# Patient Record
Sex: Male | Born: 1950 | Race: Black or African American | Hispanic: No | Marital: Married | Smoking: Never smoker
Health system: Southern US, Community
[De-identification: ages and names within clinical notes are randomized; demographics above are authoritative.]

## PROBLEM LIST (undated history)

## (undated) DIAGNOSIS — M545 Low back pain, unspecified: Secondary | ICD-10-CM

## (undated) DIAGNOSIS — I509 Heart failure, unspecified: Secondary | ICD-10-CM

## (undated) DIAGNOSIS — I219 Acute myocardial infarction, unspecified: Secondary | ICD-10-CM

## (undated) DIAGNOSIS — M79673 Pain in unspecified foot: Secondary | ICD-10-CM

## (undated) DIAGNOSIS — E669 Obesity, unspecified: Secondary | ICD-10-CM

## (undated) DIAGNOSIS — N184 Chronic kidney disease, stage 4 (severe): Secondary | ICD-10-CM

## (undated) DIAGNOSIS — D649 Anemia, unspecified: Secondary | ICD-10-CM

## (undated) DIAGNOSIS — I639 Cerebral infarction, unspecified: Secondary | ICD-10-CM

## (undated) DIAGNOSIS — M1990 Unspecified osteoarthritis, unspecified site: Secondary | ICD-10-CM

## (undated) DIAGNOSIS — I10 Essential (primary) hypertension: Secondary | ICD-10-CM

## (undated) DIAGNOSIS — I739 Peripheral vascular disease, unspecified: Secondary | ICD-10-CM

## (undated) DIAGNOSIS — E079 Disorder of thyroid, unspecified: Secondary | ICD-10-CM

## (undated) DIAGNOSIS — I2510 Atherosclerotic heart disease of native coronary artery without angina pectoris: Secondary | ICD-10-CM

## (undated) DIAGNOSIS — K219 Gastro-esophageal reflux disease without esophagitis: Secondary | ICD-10-CM

## (undated) DIAGNOSIS — E785 Hyperlipidemia, unspecified: Secondary | ICD-10-CM

## (undated) DIAGNOSIS — H269 Unspecified cataract: Secondary | ICD-10-CM

## (undated) DIAGNOSIS — Z9289 Personal history of other medical treatment: Secondary | ICD-10-CM

## (undated) DIAGNOSIS — G8929 Other chronic pain: Secondary | ICD-10-CM

## (undated) DIAGNOSIS — Z95810 Presence of automatic (implantable) cardiac defibrillator: Secondary | ICD-10-CM

## (undated) HISTORY — DX: Hyperlipidemia, unspecified: E78.5

## (undated) HISTORY — DX: Essential (primary) hypertension: I10

## (undated) HISTORY — DX: Peripheral vascular disease, unspecified: I73.9

## (undated) HISTORY — PX: COLONOSCOPY: SHX174

## (undated) HISTORY — PX: INSERT / REPLACE / REMOVE PACEMAKER: SUR710

## (undated) HISTORY — PX: INCISE AND DRAIN ABCESS: PRO64

## (undated) HISTORY — PX: TONSILLECTOMY: SUR1361

## (undated) HISTORY — DX: Obesity, unspecified: E66.9

## (undated) HISTORY — DX: Cerebral infarction, unspecified: I63.9

## (undated) HISTORY — DX: Pain in unspecified foot: M79.673

## (undated) HISTORY — PX: CHOLECYSTECTOMY: SHX55

## (undated) HISTORY — PX: PERIPHERAL ARTERIAL STENT GRAFT: SHX2220

## (undated) HISTORY — DX: Anemia, unspecified: D64.9

## (undated) HISTORY — PX: I & D EXTREMITY: SHX5045

## (undated) HISTORY — PX: CARDIAC CATHETERIZATION: SHX172

## (undated) HISTORY — DX: Gastro-esophageal reflux disease without esophagitis: K21.9

## (undated) HISTORY — DX: Acute myocardial infarction, unspecified: I21.9

## (undated) HISTORY — DX: Atherosclerotic heart disease of native coronary artery without angina pectoris: I25.10

---

## 1995-05-24 DIAGNOSIS — I639 Cerebral infarction, unspecified: Secondary | ICD-10-CM

## 1995-05-24 HISTORY — DX: Cerebral infarction, unspecified: I63.9

## 1998-08-04 ENCOUNTER — Encounter: Payer: Self-pay | Admitting: Gastroenterology

## 1998-08-11 ENCOUNTER — Encounter: Payer: Self-pay | Admitting: Gastroenterology

## 1999-08-06 ENCOUNTER — Emergency Department (HOSPITAL_COMMUNITY): Admission: EM | Admit: 1999-08-06 | Discharge: 1999-08-06 | Payer: Self-pay | Admitting: Emergency Medicine

## 1999-10-29 ENCOUNTER — Encounter: Admission: RE | Admit: 1999-10-29 | Discharge: 2000-01-27 | Payer: Self-pay | Admitting: Internal Medicine

## 2001-02-04 ENCOUNTER — Emergency Department (HOSPITAL_COMMUNITY): Admission: EM | Admit: 2001-02-04 | Discharge: 2001-02-04 | Payer: Self-pay | Admitting: Emergency Medicine

## 2001-10-27 ENCOUNTER — Emergency Department (HOSPITAL_COMMUNITY): Admission: EM | Admit: 2001-10-27 | Discharge: 2001-10-27 | Payer: Self-pay | Admitting: Emergency Medicine

## 2002-03-06 ENCOUNTER — Emergency Department (HOSPITAL_COMMUNITY): Admission: EM | Admit: 2002-03-06 | Discharge: 2002-03-06 | Payer: Self-pay | Admitting: Emergency Medicine

## 2002-07-08 ENCOUNTER — Ambulatory Visit: Admission: RE | Admit: 2002-07-08 | Discharge: 2002-07-08 | Payer: Self-pay | Admitting: Internal Medicine

## 2002-07-09 ENCOUNTER — Inpatient Hospital Stay (HOSPITAL_COMMUNITY): Admission: EM | Admit: 2002-07-09 | Discharge: 2002-07-12 | Payer: Self-pay | Admitting: Emergency Medicine

## 2002-07-10 ENCOUNTER — Encounter: Payer: Self-pay | Admitting: Internal Medicine

## 2002-07-17 ENCOUNTER — Ambulatory Visit (HOSPITAL_COMMUNITY): Admission: RE | Admit: 2002-07-17 | Discharge: 2002-07-17 | Payer: Self-pay | Admitting: Internal Medicine

## 2002-07-17 ENCOUNTER — Encounter: Payer: Self-pay | Admitting: Internal Medicine

## 2002-07-20 ENCOUNTER — Emergency Department (HOSPITAL_COMMUNITY): Admission: EM | Admit: 2002-07-20 | Discharge: 2002-07-21 | Payer: Self-pay | Admitting: Emergency Medicine

## 2002-07-26 ENCOUNTER — Encounter: Payer: Self-pay | Admitting: Orthopedic Surgery

## 2002-07-26 ENCOUNTER — Inpatient Hospital Stay (HOSPITAL_COMMUNITY): Admission: AD | Admit: 2002-07-26 | Discharge: 2002-07-29 | Payer: Self-pay | Admitting: Orthopedic Surgery

## 2002-07-29 ENCOUNTER — Encounter (INDEPENDENT_AMBULATORY_CARE_PROVIDER_SITE_OTHER): Payer: Self-pay | Admitting: *Deleted

## 2002-08-06 ENCOUNTER — Encounter: Admission: RE | Admit: 2002-08-06 | Discharge: 2002-11-04 | Payer: Self-pay | Admitting: Internal Medicine

## 2002-09-05 ENCOUNTER — Emergency Department (HOSPITAL_COMMUNITY): Admission: EM | Admit: 2002-09-05 | Discharge: 2002-09-05 | Payer: Self-pay | Admitting: Emergency Medicine

## 2003-03-17 ENCOUNTER — Ambulatory Visit (HOSPITAL_COMMUNITY): Admission: RE | Admit: 2003-03-17 | Discharge: 2003-03-17 | Payer: Self-pay | Admitting: Cardiology

## 2003-03-17 ENCOUNTER — Encounter: Payer: Self-pay | Admitting: Cardiology

## 2003-03-19 ENCOUNTER — Inpatient Hospital Stay (HOSPITAL_COMMUNITY): Admission: RE | Admit: 2003-03-19 | Discharge: 2003-03-21 | Payer: Self-pay | Admitting: Cardiology

## 2003-04-04 ENCOUNTER — Ambulatory Visit (HOSPITAL_COMMUNITY): Admission: RE | Admit: 2003-04-04 | Discharge: 2003-04-04 | Payer: Self-pay | Admitting: Cardiology

## 2003-04-21 ENCOUNTER — Encounter (HOSPITAL_COMMUNITY): Admission: RE | Admit: 2003-04-21 | Discharge: 2003-07-20 | Payer: Self-pay | Admitting: Cardiology

## 2004-04-05 ENCOUNTER — Ambulatory Visit: Payer: Self-pay | Admitting: Cardiology

## 2004-04-06 ENCOUNTER — Ambulatory Visit: Payer: Self-pay

## 2004-04-08 ENCOUNTER — Ambulatory Visit: Payer: Self-pay | Admitting: Internal Medicine

## 2004-05-06 ENCOUNTER — Ambulatory Visit: Payer: Self-pay | Admitting: Cardiology

## 2004-06-09 ENCOUNTER — Encounter: Admission: RE | Admit: 2004-06-09 | Discharge: 2004-06-09 | Payer: Self-pay | Admitting: Nephrology

## 2004-06-10 ENCOUNTER — Encounter: Admission: RE | Admit: 2004-06-10 | Discharge: 2004-09-08 | Payer: Self-pay | Admitting: Nephrology

## 2004-06-24 ENCOUNTER — Encounter: Admission: RE | Admit: 2004-06-24 | Discharge: 2004-06-24 | Payer: Self-pay | Admitting: Nephrology

## 2004-08-02 ENCOUNTER — Ambulatory Visit: Payer: Self-pay | Admitting: Cardiology

## 2004-08-24 ENCOUNTER — Ambulatory Visit: Payer: Self-pay

## 2004-08-26 ENCOUNTER — Ambulatory Visit: Payer: Self-pay | Admitting: Cardiology

## 2004-10-14 ENCOUNTER — Ambulatory Visit: Payer: Self-pay | Admitting: Internal Medicine

## 2005-03-18 ENCOUNTER — Ambulatory Visit: Payer: Self-pay | Admitting: Internal Medicine

## 2005-03-21 ENCOUNTER — Ambulatory Visit: Payer: Self-pay | Admitting: Internal Medicine

## 2005-10-05 ENCOUNTER — Ambulatory Visit: Payer: Self-pay | Admitting: Cardiology

## 2006-04-18 ENCOUNTER — Ambulatory Visit: Payer: Self-pay | Admitting: Cardiology

## 2006-04-25 ENCOUNTER — Ambulatory Visit: Payer: Self-pay

## 2006-05-01 ENCOUNTER — Ambulatory Visit: Payer: Self-pay

## 2006-05-23 HISTORY — PX: GASTRIC BYPASS: SHX52

## 2006-05-30 ENCOUNTER — Encounter: Admission: RE | Admit: 2006-05-30 | Discharge: 2006-05-30 | Payer: Self-pay | Admitting: Nephrology

## 2006-05-31 ENCOUNTER — Ambulatory Visit: Payer: Self-pay | Admitting: Cardiology

## 2007-02-28 DIAGNOSIS — E119 Type 2 diabetes mellitus without complications: Secondary | ICD-10-CM

## 2007-02-28 DIAGNOSIS — I10 Essential (primary) hypertension: Secondary | ICD-10-CM | POA: Insufficient documentation

## 2007-02-28 DIAGNOSIS — N19 Unspecified kidney failure: Secondary | ICD-10-CM | POA: Insufficient documentation

## 2007-02-28 DIAGNOSIS — I509 Heart failure, unspecified: Secondary | ICD-10-CM | POA: Insufficient documentation

## 2007-02-28 DIAGNOSIS — Z8679 Personal history of other diseases of the circulatory system: Secondary | ICD-10-CM | POA: Insufficient documentation

## 2007-02-28 DIAGNOSIS — I2510 Atherosclerotic heart disease of native coronary artery without angina pectoris: Secondary | ICD-10-CM | POA: Insufficient documentation

## 2008-01-02 ENCOUNTER — Ambulatory Visit: Payer: Self-pay | Admitting: Cardiology

## 2008-08-25 ENCOUNTER — Encounter: Payer: Self-pay | Admitting: Gastroenterology

## 2008-10-09 ENCOUNTER — Ambulatory Visit: Payer: Self-pay | Admitting: Gastroenterology

## 2008-10-09 DIAGNOSIS — R195 Other fecal abnormalities: Secondary | ICD-10-CM

## 2008-10-09 DIAGNOSIS — K219 Gastro-esophageal reflux disease without esophagitis: Secondary | ICD-10-CM | POA: Insufficient documentation

## 2008-10-09 DIAGNOSIS — D509 Iron deficiency anemia, unspecified: Secondary | ICD-10-CM

## 2008-12-09 ENCOUNTER — Telehealth: Payer: Self-pay | Admitting: Gastroenterology

## 2008-12-10 ENCOUNTER — Ambulatory Visit: Payer: Self-pay | Admitting: Gastroenterology

## 2009-03-23 ENCOUNTER — Encounter: Payer: Self-pay | Admitting: Internal Medicine

## 2010-06-12 ENCOUNTER — Encounter: Payer: Self-pay | Admitting: Cardiology

## 2010-07-16 ENCOUNTER — Encounter: Payer: Self-pay | Admitting: Internal Medicine

## 2010-08-10 NOTE — Letter (Signed)
Summary: [facility]  [facility]   Imported By: Marylou Mccoy 08/03/2010 14:42:04  _____________________________________________________________________  External Attachment:    Type:   Image     Comment:   External Document

## 2010-08-29 LAB — GLUCOSE, CAPILLARY: Glucose-Capillary: 74 mg/dL (ref 70–99)

## 2010-10-05 NOTE — Assessment & Plan Note (Signed)
[facility]                            CARDIOLOGY OFFICE NOTE   NAME:Willie [name], Willie [name]                       MRN:          [id number]  DATE:01/02/2008                            DOB:          Oct 16, 1950    PRIMARY CARE PHYSICIAN:  Dr. Chales Abrahams in [place].   PRIMARY CARDIOLOGIST:  Marca Ancona, MD, new.   He was patient of Dr. Samule Ohm.   RENAL:  Dr. Darrick Penna.   This is a 60 year old African American male patient who has a long  history of atherosclerotic coronary artery disease and ischemic  cardiomyopathy.  He was last seen in January 2008 at which time he  obtained cardiac clearance for undergoing gastric bypass surgery.  Since  then, he has dropped his weight from 316 pounds down to 186 and done  well.  Multiple medications have been stopped, since his weight loss.  He comes in today because he has occasions when he stands up where he  feels a weakness from his waist down, like he cannot stand.  He denies  any dizziness, presyncope, chest pain, or shortness of breath associated  with this.  He said when he was on vacation, he had switched to his  Coreg from the evening to the morning and he actually felt worse when he  did this.  He has not taken his Coreg in over 24 hours and his blood  pressure is up, when he comes in today.  This is the only  antihypertensive that he is currently on as the rest have been  discontinued after his weight loss.   CURRENT MEDICATIONS:  1. Aciphex 20 mg daily.  2. Coreg 40 mg daily.  3. Aspirin 81 mg daily.  4. Simvastatin 20 mg daily.  5. Ferrous sulfate 5 mg daily.  6. Vitamin D 5000 IU weekly.  7. Zemplar 1 mcg daily.  8. Isopure protein drink daily.   PHYSICAL EXAMINATION:  GENERAL:  This is a very pleasant 60 year old  [place] male, in no acute distress.  VITAL SIGNS:  Blood pressure 159/93, pulse 60, and weight 186.  We did  do orthostatics and his blood pressure actually when lying down was  166/92, sitting 151/88, and when he stood up it was 124/80.  He did not  get the weakness sensation when his blood pressure dropped after 5  minutes of standing, hence blood pressure came back up to 137/80.  NECK:  Without JVD, HJR, bruit, or thyroid enlargement.  LUNGS:  Clear in anterior, posterior, and lateral.  HEART:  Regular rate and rhythm at 60 beats per minute.  Normal S1 and  S2.  Positive S4.  No murmur, bruit, thrill, or heave noted.  ABDOMEN:  Soft without organomegaly, masses, lesions, or abnormal tenderness.  EXTREMITIES:  Without cyanosis, clubbing, or edema.  He has good distal  pulses.   IMPRESSION:  1. Bilateral leg weakness when he stands, question secondary to      orthostatic hypotension.  2. Hypertension.  3. Coronary artery disease with total right coronary artery 90% distal      circumflex and 40%  left anterior descending on catheterization in      2004, treated medically.  4. Ischemic cardiomyopathy, ejection fraction 27% on echo in 2007.  5. Morbid obesity, status post gastric bypass surgery with significant      weight loss.  6. Chronic renal insufficiency.  7. Hypercholesterolemia.  8. Peripheral vascular disease.  9. Diabetes mellitus.   PLAN:  I have asked the patient to continue his Coreg in the evening.  He will keep track of his blood pressures and pulse at home and call us  with these results.  In the interim, he will then see Dr. Shirlee Latch back in  follow up in 1 month.       Jacolyn Reedy, PA-C  Electronically Signed      Luis Abed, MD, [facility]  Electronically Signed   ML/MedQ  DD: 01/02/2008  DT: 01/03/2008  Job #: [id number]

## 2010-10-08 NOTE — Op Note (Signed)
NAMEJURGEN, Willie [name]                          ACCOUNT NO.:  [id number]   MEDICAL RECORD NO.:  [id number]                   PATIENT TYPE:  INP   LOCATION:  3741                                 FACILITY:  [facility]   PHYSICIAN:  Leonides Grills, M.D.                  DATE OF BIRTH:  [birth year]   DATE OF PROCEDURE:  07/28/2002  DATE OF DISCHARGE:                                 OPERATIVE REPORT   PREOPERATIVE DIAGNOSES:  1. Left fourth metatarsal head osteomyelitis.  2. Left plantar foot ulcer.  3. Left tight Achilles tendon.   POSTOPERATIVE DIAGNOSES:  1. Left fourth metatarsal head osteomyelitis.  2. Left plantar foot ulcer.  3. Left tight Achilles tendon.   PROCEDURES:  1. Left fourth metatarsal head resection.  2. Left debridement, full-thickness skin plantar foot ulcers.  3. Left percutaneous tendo Achilles lengthening.   ANESTHESIA:  General endotracheal tube.   SURGEON:  Leonides Grills, M.D.   ASSISTANT:  Lianne Cure, P.A.   ESTIMATED BLOOD LOSS:  Minimal.   TOURNIQUET:  None.   COMPLICATIONS:  None.   DISPOSITION:  Stable to PAR.   INDICATIONS:  This is a 60 year old gentleman who has had swelling and pain  involving his left foot.  He is an insulin-dependent diabetic.  Bone scan  was suspicious for osteomyelitis, and MRI proved it was osteomyelitis  isolated to the left fourth metatarsal head.  There was no surrounding  abscess to this.  He did develop a plantar foot ulcer as well and because of  the fact that he had a tight heel cord, it was deemed necessary to do the  percutaneous tendo Achilles lengthening to help not only heal the present  ulcer but prevent future ulcers from occurring.  He was consented for the  above procedure.  All risks, which include infection, nerve or vessel  injury, advancement of the infection with possible amputation, and further  amputation, were all explained, questions were encouraged and answered.   DESCRIPTION OF PROCEDURE:   Patient brought to the operating room and placed  in a supine position after adequate general endotracheal tube anesthesia was  administered as well as the patient was already on Levaquin IV piggyback.  The left lower extremity was then prepped and draped in a sterile manner.  We started the procedure with two medial and one lateral hemisection of the  Achilles tendon.  This had excellent release of the tight Achilles tendon.  We then made a dorsal incision over the fourth MTP joint.  Dissection was  carried down to the extensor tendons.  The extensor tendon retinaculum was  incised laterally and the tendons were retracted medially.  The head was  then identified.  There was slight purulence in this area once the capsule  was then opened.  This was then cultured and Gram stain had been sent.  Stat  Gram stain revealed that he  had rare monos and polys and rare rbc's, no  organisms identified.  The wound again was copiously irrigated with normal  saline.  We closed only the corners of the wound and left a large opening in  the wound for packing and dressing changes.  Plantarly we debrided the skin  full-thickness as well.  There was no active purulence from this area or  erythema.  The toes also had a capillary refill of approximately two to  three seconds of the third and fourth toes, and the second and fifth toes  were less than two seconds.  Once the wound was copiously irrigated with  normal saline, a 4 x 4 dressing was packed into the wound.  A Xeroform was  placed on both the Achilles tendon and the forefoot wound.  At this point  the patient was stable to the PAR.                                               Leonides Grills, M.D.    PB/MEDQ  D:  07/28/2002  T:  07/28/2002  Job:  [id number]

## 2010-10-08 NOTE — Discharge Summary (Signed)
   NAMEMATTIS, FEATHERLY                          ACCOUNT NO.:  [id number]   MEDICAL RECORD NO.:  [id number]                   PATIENT TYPE:  OIB   LOCATION:  2861                                 FACILITY:  [facility]   PHYSICIAN:  Salvadore Farber, M.D.             DATE OF BIRTH:  1950-09-07   DATE OF ADMISSION:  03/19/2003  DATE OF DISCHARGE:                           DISCHARGE SUMMARY - REFERRING   This dictation was canceled.      Lavella Hammock, P.A. [facility]                  Salvadore Farber, M.D.    RG/MEDQ  D:  03/19/2003  T:  03/19/2003  Job:  [id number]

## 2010-10-08 NOTE — Discharge Summary (Signed)
Willie [name], [name]                          ACCOUNT NO.:  [id number]   MEDICAL RECORD NO.:  [id number]                   PATIENT TYPE:  INP   LOCATION:  0375                                 FACILITY:  [facility]   PHYSICIAN:  Rene Paci, M.D. [facility]          DATE OF BIRTH:  1950/11/05   DATE OF ADMISSION:  07/09/2002  DATE OF DISCHARGE:  07/12/2002                                 DISCHARGE SUMMARY   DISCHARGE DIAGNOSES:  1. Diabetic foot ulcer, slowly improving, stable.  2. Phlebitis secondary to superficial venous thrombosis, stable.  3. Type 2 diabetes poorly controlled, but improving.  4. Hypercholesterolemia.  5. History of stroke with mild left hemiparesis on chronic Coumadin.  6. Hypertension.  7. Chronic renal insufficiency.  Discharge creatinine 2.7.   DISCHARGE MEDICATIONS:  1. Augmentin XR one tablet p.o. daily until otherwise instructed.  2. Lantus 20 units subcutaneous q.h.s.  3. Actos 45 mg p.o. daily.  4. Coumadin 10 mg p.o. daily.  5. Desyrel 100 mg p.o. q.h.s.  6. Crestor 40 mg p.o. daily.  7. Catapres-TTS-2 patch q.7 days.  8. Vicodin 5/500 one to two tablets p.o. q.6h. p.r.n. pain.   DISPOSITION:  The patient is discharged home.  To continue his left foot  elevation and bed rest.  He will see his primary care physician, Willie [name].  Swords, M.D. [facility] next week on Tuesday the 24th at 4 p.m. to follow up on  progression of treatment of his foot wound and cellulitis.   CONDITION ON DISCHARGE:  Medically stable and slowly improved.   HOSPITAL COURSE:  Problem 1 - DIABETIC FOOT WOUND AND CELLULITIS:  The  patient was evaluated in the emergency room day of admission secondary to  increased swelling and redness of his left foot.  He had been seen the day  prior by his primary care physician who had initiated Augmentin therapy.  He  had had less than 24 hours of this treatment and was concerned because of  continued pain and increasing swelling while the foot was in  the dependent  position.  He was febrile with 101 and admitted for IV antibiotics and  further evaluation.  He was treated with Rocephin during this  hospitalization and his white count improved, though remains mildly elevated  due to ongoing infection.  He was evaluated by wound care who felt that no  additional wound treatment is necessary at this time and recommended only  continuation of antibiotic therapy and foot elevation.  He was discharged  home to continue p.o. antibiotics and will be closely monitored as an  outpatient for continued progression and healing.  Problem 2 - TYPE 2 DIABETES:  The patient has a long history of poor  compliance with his diabetes in the past.  He was initiated on Lantus as his  CBGs have been extremely exaggerated likely secondary to infection.  He has  been educated on insulin use  and will continue his other home medications  including Actos 45 units daily.  He is reportedly followed by diabetic  specialist, Willie [name], M.D. [facility] and this will be continually arranged  by his primary care physician.  Problem 3 - CHRONIC RENAL INSUFFICIENCY:  The patient was admitted with a  creatinine of 3.0.  His ACE inhibitor was held and he was gently hydrated  with normal saline 50 units/hour during this hospitalization.  His  creatinine improved to 2.5 and bumped up again slightly to 2.7 at time of  discharge.  He has had adequate urine output.  Other follow-up will be  managed by his primary care physician.   LABORATORY DATA:  At time of discharge, February, 20 includes sodium 132,  potassium 4.2, chloride 100, bicarbonate 28, glucose 247, BUN 37, creatinine  2.7.  White count 11.8, hemoglobin 10.9, platelet count 248,000.                                               Rene Paci, M.D. [facility]    VL/MEDQ  D:  07/12/2002  T:  07/12/2002  Job:  [id number]

## 2010-10-08 NOTE — Cardiovascular Report (Signed)
NAMEARMSTEAD, HEILAND                          ACCOUNT NO.:  [id number]   MEDICAL RECORD NO.:  [id number]                   PATIENT TYPE:  OIB   LOCATION:  2861                                 FACILITY:  [facility]   PHYSICIAN:  Salvadore Farber, M.D.             DATE OF BIRTH:  [birth year]   DATE OF PROCEDURE:  03/19/2003  DATE OF DISCHARGE:                              CARDIAC CATHETERIZATION   PROCEDURE:  Left and right heart catheterization, coronary angiography.   INDICATIONS:  Mr. Witkop is a 60 year old gentleman with history of reduced  LV function and congestive heart failure in the setting of diabetes and  hypertension.  Reduced LV function dates at least to 1997, though details  back then are unavailable.  More recently he had a Cardiolite showing an EF  of 29% with inferior infarct and periinfarct ischemia.  Symptoms have been  primarily exertional dyspnea.  Based on reduced EF and high suspicion of  coronary disease he is referred for diagnostic angiography to exclude  multivessel disease.   PROCEDURAL TECHNIQUE:  Informed consent was obtained.  Under 1% lidocaine  local anesthesia a 6-French sheath was placed in the right femoral artery  using the modified Seldinger technique.  An 8-French sheath was placed in  the right femoral vein using modified Seldinger technique.  Right heart  catheterization was performed using a balloon tip catheter.  Cardiac output  was calculated using both Fick and thermodilution methods.  Coronary  angiography was performed using JL4 and JR4 catheters.  No ventriculogram  was performed to minimize dye load.   COMPLICATIONS:  None.   FINDINGS:  HEMODYNAMICS:  1. RA 18/18/16.  2. RV 45/17/18.  3. PA 48/32/38.  4. PCW 28/26/25.  5. LV 166/22/36.  6. Cardiac output/index (Fick) 8.0/3.3, (thermodilution) 5.6/2.3.  7. No aortic stenosis or mitral stenosis.   CORONARY ANGIOGRAPHY:  1. Left main:  Angiographically normal.  2. LAD:  Moderate  sized vessel giving rise to three small diagonals.  There     is a 40% stenosis at the mid vessel.  3. Circumflex:  Large vessel giving rise to a single large branching     marginal.  The distal circumflex has a 90% stenosis in approximately 2.0-     2.25 mm vessel.  The upper division of the branching obtuse marginal has     a 60% proximal stenosis.  4. RCA:  Occluded in its mid section with distal vessel filling faintly via     bridging collaterals as well as collaterals from the left.   IMPRESSION:  1. Chronic total occlusion of the right coronary artery.  Cardiolite     suggested majority of this, if not all of this territory is infarcted.  2. Severe stenosis of the distal circumflex.  3. Moderate disease of obtuse marginal 1 and left anterior descending.   PLAN:  Will continue bicarbonate protocol for prevention of contrast  nephropathy.  Will discharge him home with plans for up titration of his ACE  inhibitor and subsequent initiation of beta blockade.                                               Salvadore Farber, M.D.    WED/MEDQ  D:  03/19/2003  T:  03/19/2003  Job:  [id number]   cc:   Valetta Mole. Swords, M.D. [facility]

## 2010-10-08 NOTE — Assessment & Plan Note (Signed)
[facility]                            CARDIOLOGY OFFICE NOTE   NAME:Willie [name], Willie [name]                       MRN:          [id number]  DATE:05/31/2006                            DOB:          February 16, 1951    HISTORY OF PRESENT ILLNESS:  Mr. Grauberger is a 60 year old gentleman with  longstanding atherosclerotic coronary disease and ischemic  cardiomyopathy.  He is contemplating gastric bypass surgery.   He was found to have coronary disease after an abnormal Cardiolite in  2004.  That demonstrated inferior infarct with periinfarct ischemia.  He  underwent catheterization demonstrating a chronic total occlusion of the  right coronary artery and a severe stenosis of the distal portion of the  circumflex.  This has been managed medically.  He has been without any  chest discomfort or exertional dyspnea.  Repeat echocardiogram done in  November 2005 demonstrated improvement in the left ventricular systolic  function to 45-55%.  However, exercise Cardiolite performed in December  2007 showed EF of 27% with global LV hypokinesis.  There was inferior  and inferolateral scar with moderate periinfarct ischemia, similar to  the 2004 study.   In addition to his coronary disease and obesity, Mr. Berthold has chronic  renal insufficiency with creatinine running approximately 2.7.  This is  followed by Dr. Darrick Penna.   Mr. Yanes again denies any chest pain and exertional dyspnea.  He  further has no orthopnea, PND, dizziness, presyncope and syncope.   His current medications are:  1. AcipHex 20 mg per day.  2. Gabapentin 200 mg each morning and 600 mg each evening.  3. Glimepiride 2 mg per day.  4. Coreg CR 40 mg per day.  5. Imdur 30 mg per day.  6. Lasix 40 mg twice per day.  7. Lisinopril 40 mg per day.  8. Calcitriol 1 mcg per day.  9. Vytorin 10/20 one per day.  10.Multivitamin.  11.He started on thiamine replacement yesterday at the request of Dr.  Chales Abrahams.   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  He says  this is going well.   PHYSICAL EXAMINATION:  He is obese but otherwise well-appearing in no  distress with a heart rate 78, blood pressure 128/76 and weight of 312  pounds.  Weight is up 4 pounds from late November.  He has no jugular  venous distention, thyromegaly, or lymphadenopathy.  Respiratory effort  is normal.  Lungs are clear to auscultation.  He has a nonpalpable point  of maximal cardiac impulse.  There is a regular rate and rhythm without  murmur, rub or gallop.  The abdomen is obese and nontender.  Bowel  sounds are normal.  I am unable to assess hepatosplenomegaly or for  pulsatile mass due to the habitus.  Extremities are warm and without  clubbing, cyanosis, edema or ulceration.   IMPRESSION/RECOMMENDATION:  1. Atherosclerotic coronary disease:  Cardiolite stable compared with      2004.  Ejection fraction is similarly low on this Cardiolite as it      was in 2004.  However, echocardiogram  between these two studies      suggested it was a bit better.  With the decreased ejection      fraction and coronary disease does increase his risk of      perioperative cardiac complication.  However, I think his risk is      acceptable if careful attention is paid to his fluid status in the      perioperative period.  His beta blocker should be maintained      throughout the perioperative period.  If he is unable to take oral      medications he should be given IV metoprolol on a regular basis.  2. Ischemic cardiomyopathy:  Continue beta blocker and ACE inhibitor.  3. Chronic renal insufficiency:  Followed by Dr. Darrick Penna.  I have no      recent records.  4. Hypertension:  Nicely controlled.  5. Hypercholesterolemia:  Apparently followed by Dr. Chales Abrahams.  6. Peripheral vascular disease:  Asymptomatic.  Managed      conservatively.  7. Diabetes mellitus.   I will plan on seeing him back in a  year.     Salvadore Farber, MD  Electronically Signed    WED/MedQ  DD: 05/31/2006  DT: 05/31/2006  Job #: [id number]   cc:   Fayrene Fearing L. Deterding, M.D.  Dr. Chales Abrahams

## 2010-10-08 NOTE — Consult Note (Signed)
NAME:  Willie [name], Willie [name]                          ACCOUNT NO.:  [id number]   MEDICAL RECORD NO.:  [id number]                   PATIENT TYPE:  INP   LOCATION:  5731                                 FACILITY:  [facility]   PHYSICIAN:  Bruce Rexene Edison. Swords, M.D. [facility]           DATE OF BIRTH:  07-Nov-1950   DATE OF CONSULTATION:  07/26/2002  DATE OF DISCHARGE:                                   CONSULTATION   REASON FOR CONSULTATION:  I am asked to see the patient by Dr. Lestine Box for  cardiac clearance.   HISTORY OF PRESENT ILLNESS:  The patient is a 60 year old male who I have  recently started seeing again for diabetes.  He has been noncompliant with  medical follow up, medications, dietary and life style compliance.  He has  recently been diagnosed osteomyelitis of the foot and is scheduled for  surgical debridement.  This will be done under general anesthesia and the  patient needs cardiac clearance.  There was a question brought up by  outpatient surgical center about the patient's cardiac function.  They have  a MUGA scan that apparently demonstrates the patient's ejection fraction in  the 30% range.  I do not have access to that information.  The patient does  have a known history of vascular disease including peripheral vascular  disease and a stroke.  Other medical history includes diabetes, hypertension  and chronic anticoagulation.  The patient has recently been hospitalized for  cellulitis of the foot and more recently with associated osteomyelitis.   OUTPATIENT MEDICATIONS:  1. Crestor 40 mg p.o. q.d.  2. Lisinopril & HCTZ 25/12.5 one p.o. q.d.  3. Lasix 80 mg 80 mg p.o. q.d.  4. Actos 45 mg p.o. q.d.  5. Lantus 20 units q.h.s.  6. Desyrel 100 mg p.o. q.h.s.  7. Catapres TTS patch #2 change it every seven days.  8. Augmentin XR two tablets b.i.d.   SOCIAL HISTORY:  He is married.  Works as a Optician, dispensing.   FAMILY HISTORY:  Mother deceased with stroke at 77.  Father deceased with  diabetes, hypertension, peripheral vascular disease and prostate cancer.  He  has no siblings.  He has three healthy children.   HABITS:  He does not smoke.  He does not drink alcohol.   PHYSICAL EXAMINATION:  VITAL SIGNS:  Temperature 98.4, blood pressure  132/80.  GENERAL:  He appears as an overweight African-American male in no acute  distress.  HEENT:  Atraumatic, normocephalic.  Extraocular muscles intact.  NECK:  Supple without lymphadenopathy, thyromegaly, jugular venous  distention or carotid bruits.  CHEST:  Clear to auscultation.  CARDIAC:  S1 and S2 are normal without murmurs or gallops.  ABDOMEN:  Obese.  Active bowel sounds, soft, nontender.  There is no  hepatosplenomegaly.  PULSES:  Femoral artery pulses are palpable with bilateral bruits.  I am  unable to feel lower extremity pulses below the  femoral arteries.  EXTREMITIES:  The left foot is wrapped.   LABORATORY DATA:  Pending.   INR from yesterday was greater than 4.   ASSESSMENT:  1. Diabetic foot ulcer with osteomyelitis.  2. Questionable history of coronary artery disease and left ventricular     dysfunction.  3. Noncompliance.  4. Hypertension.  5. Hyperlipidemia.  6. Peripheral vascular disease.  7. History of stroke.  8. Chronic anticoagulation.   PLAN:  I have asked for Persantine Cardiolite to be done soon.  Will  discontinue Warfarin and check his prothrombin time prior to surgery.  Will  continue his home medications.  Will consider beta blocker before cardiac  surgery.                                                 Bruce Rexene Edison Swords, M.D. [facility]    BHS/MEDQ  D:  07/26/2002  T:  07/26/2002  Job:  [id number]   cc:   Leonides Grills, M.D.  [address]  [place]  [place] [address]  Fax: [phone]

## 2010-10-08 NOTE — H&P (Signed)
NAME:  Willie [name], Willie [name]                          ACCOUNT NO.:  [id number]   MEDICAL RECORD NO.:  [id number]                   PATIENT TYPE:  INP   LOCATION:  5731                                 FACILITY:  [facility]   PHYSICIAN:  Willie [name], M.D.                  DATE OF BIRTH:  15-Feb-1951   DATE OF ADMISSION:  07/26/2002  DATE OF DISCHARGE:                                HISTORY & PHYSICAL   HISTORY OF PRESENT ILLNESS:  Willie [name] had come to our office on 07/18/02  with left foot swelling and a blister with ulceration on his foot.  He was  referred to [place] by Willie [name] for further evaluation and treatment.  We  started him on antibiotics of Levaquin 500 mg p.o. daily for six weeks, and  wanted to do dressing changes with antibiotic ointment and Panafil, and we  also sent him for an MRI.  When the MRI was returned, it showed that he had  osteomyelitis in the fourth metatarsal head area.  At that point, we decided  to admit him to [facility] and have Willie [name] tee him up for surgery.  Planned surgery Sunday at 10:30 a.m. for excision of fourth metatarsal head  by Dr. Leonides [name].   PAST MEDICAL HISTORY:  1. Type 1 diabetes mellitus x20 years.  2. Peripheral neuropathy.  3. Hypertension.  4. Diabetes.  5. History of a CVA.   ALLERGIES:  No known drug allergies.   CURRENT MEDICATIONS:  1. Catapres patch apply q7 days.  2. Crestor 20 mg p.o. daily.  3. Lente 20 units q.h.s. subcutaneous.  4. Humalog 20 units q.a.m., 25 units q at noontime, and 25 units before     dinner.  5. He was also placed on metoprolol 25 mg p.o. b.i.d.  6. Actos 45 p.o. daily.  7. Vitamin K 2.5 mg p.o. daily.  8. Lasix 80 mg p.o. daily.   SOCIAL HISTORY:  He does not smoke.  No ethanol use.  Lives with his wife.  Has children.   PAST SURGICAL HISTORY:  No history of surgery to date.   When he is admitted to [facility] today on 07/26/02, he had a temperature of 99,  heart rate 83, respirations 20,  blood pressure 132/66.   REVIEW OF SYSTEMS:  CARDIAC:  No angina, no arrhythmia, no murmurs, no CHF.  PULMONARY:  No COPD.  No shortness of breath.  No chronic cough.  NEUROLOGIC:  No seizures.  He does have a history of stroke in 1997.  He has  no weakness, no numbness.  MUSCULOSKELETAL:  No problems.  SKIN:  He has a  left foot ulcer and blistering on the plantar surface.  GI:  No  constipation, no diarrhea.  No incontinence.  No melena.  GU:  No urgency or  frequency.  No incontinence.  No hematuria.  No nocturia.  ENT:  No impaired  vision.  No glaucoma.  Pupils equal, round and reactive to light.  No  impaired hearing.  No dentures.   PHYSICAL EXAMINATION:  GENERAL:  He is a 6'1 tall man who weighs 270  pounds.  Well-developed, well-nourished, in no apparent distress.  EXTREMITIES:  He has an eschar on the plantar aspect of his left foot.  He  has wrinkles in his skin which are evident.  He has decreased swelling.  He  has decreased sensation in the bilateral lower extremities.  He has eschar  below the fourth metatarsal head on the plantar surface, and sheering effect  with a blister that is actually purulent underneath the area.  Active range  of motion intact.  Bilateral lower extremity numbness.  HEENT:  On physical examination, pupils equal, round and reactive to light.  NECK:  Supple.  No adenopathy.  CHEST:  Lungs are clear to auscultation bilaterally.  No rales, no wheezes.  ABDOMEN:  Soft, nontender to palpation.  Positive bowel sounds.  SKIN:  As described above.   MRI with positive osteomyelitis involving the fourth metatarsal head, as  well as on bone scan.   ASSESSMENT:  1. Left foot ulcer, status post debridement, full thickness, as well as     subcutaneous.  2. Diabetes mellitus type 1 x20 years.  3. Peripheral neuropathy.  4. 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  5. Hypertension.  6. Past cerebrovascular accident.  7. Peripheral neuropathy,  bilateral.   PLAN:  Our plan at this point is to take him to the OR on Sunday, 07/28/02, at  10:30 a.m. to excise the fourth metatarsal head on the left foot at [facility].     Lianne Cure, P.A.                     Willie [name], M.D.    MC/MEDQ  D:  07/26/2002  T:  07/27/2002  Job:  [id number]

## 2010-10-08 NOTE — Discharge Summary (Signed)
Willie [name], Willie [name]                          ACCOUNT NO.:  [id number]   MEDICAL RECORD NO.:  [id number]                   PATIENT TYPE:  INP   LOCATION:  3741                                 FACILITY:  [facility]   PHYSICIAN:  Leonides Grills, M.D.                  DATE OF BIRTH:  1950/08/29   DATE OF ADMISSION:  07/26/2002  DATE OF DISCHARGE:  07/29/2002                                 DISCHARGE SUMMARY   REASON FOR ADMISSION:  The patient comes in for left metatarsal  osteomyelitis.  He is admitted to [facility] for  preoperative clearance.  Dr. Valetta Mole. Swords did medical clearance for [place].   MEDICATIONS:  1. Crestor 40 mg.  2. Lisinopril.  3. HCTZ 25/12.5 mg q.d.  4. Lasix 80 mg q.d.  5. Actos 45 mg q.d.  6. Lantus 20 units q.h.s.  7. Desyrel 100 mg p.o. q.h.s.  8. Catapres patch every seven days.  9. Augmentin SR 2 tablets.   HISTORY OF PRESENT ILLNESS:  This is a 60 year old male who recently started  seeing Dr. Valetta Mole. Swords for his diabetes again.  The patient was recently  hospitalized for cellulitis of the foot and most recently discovered  osteomyelitis.   SOCIAL HISTORY:  He is married.  Works as a Optician, dispensing.  Nonsmoker and  nondrinker.   FAMILY HISTORY:  Mother deceased of stroke at 63.  Father deceased of  diabetes, hypertension, peripheral vascular disease, and prostate cancer.   HOSPITAL COURSE:  On July 26, 2002, he was admitted.  He was put on Levaquin  500 mg IV q.d. and we consented him for excision of left fourth metatarsal  head by Dr. Leonides Grills.  He was maintained on his home medications.  He  was made NPO after midnight on July 27, 2002.  Given medical clearance by  Dr. Valetta Mole. Swords. He was given vitamin K 10 mg p.o. secondary reversal.   On July 28, 2002, he went to the OR for left foot excision fourth metatarsal  head secondary to osteomyelitis.  He was restarted on his home medications.  A Jones dressing was applied to left  lower extremity.  He continued  nonweightbearing and elevations.  He continued Levaquin 500 mg IV q.d. for  antibiotics.  We contacted [employer] for a total contact Bledsoe boot to be  delivered in the morning.  We also consulted Dr. Cliffton Asters for  infectious disease to make sure that we were giving him the correct  antibiotics.  He was placed on Vicodin and Morphine for breakthrough and  Robaxin for muscle spasms.  Dressing changes were due to be done b.i.d. and  we were going to restart Regranex to a plantar ulcer wound.  Iodoform  packing and cover with Kerlix.   Levaquin was discontinued and Cipro 750 mg p.o. b.i.d. and clindamycin 300  mg p.o. b.i.d.  was started on July 28, 2002 by Dr. Cliffton Asters.  Discharge  planning was ordered as long as it was agreeable with Dr. Valetta Mole. Swords.  Discharge planning to go home was done on July 29, 2002.  The patient was  placed back on Coumadin and PT and INR was followed by pharmacy.  We gave  him orders that he could get a wheelchair for mobility.  He is to maintain  nonweightbearing.   DISCHARGE INSTRUCTIONS:  Wheelchair for mobility.  Nonweightbearing left  lower extremity.  Keep the splint clean, dry, and intact.  He is to follow  up in our office on Thursday with Dr. Leonides Grills.  Continue his home  medications.   DISCHARGE MEDICATIONS:  1. Cipro 750 mg p.o. b.i.d. x 6 weeks.  2. Clindamycin 300 mg p.o. q.i.d. x 6 weeks.  3. Vicodin 5/500 with 1 to 3 tablets p.o. q.4-6h. p.r.n. pain.   LABORATORY DATA:  Preoperative labs were done.  He had an EKG that showed  normal sinus rhythm.  Cannot rule out inferior infarct, age undetermined and  this was confirmed by Dr. Erskine Speed.  He also did preoperative  respiratory exam to the chest.  Normal chest is the impression.  This was  read by Dr. Janeece Riggers. Shogry.   Preoperative labs that were done were white count 9.3, hemoglobin 11,  hematocrit 32.4, platelets 610,000.  PT was 31.9, INR  was 4.0, PTT 134.  Sodium 133, potassium 4.3, chloride 99, CO2 27, glucose 107, BUN 70,  creatinine 3.5, albumin 2.9.  Liver labs show AST was 16, ALT 19, ALP 54,  TBIL was 0.5.  Gram-stain that was sent on July 28, 2002 showed rare white  blood cells.  No organisms seen.  Wound culture that was sent on July 28, 2002 preoperatively showed no organisms per Gram-stain x 2 days and on the  third day no organisms seen, rare white blood cells.  No anaerobes isolated.     Lianne Cure, P.A.                     Leonides Grills, M.D.    MC/MEDQ  D:  09/06/2002  T:  09/07/2002  Job:  [id number]

## 2010-10-08 NOTE — H&P (Signed)
NAMEARIF, AMENDOLA                          ACCOUNT NO.:  [id number]   MEDICAL RECORD NO.:  [id number]                   PATIENT TYPE:  OIB   LOCATION:  2861                                 FACILITY:  [facility]   PHYSICIAN:  Salvadore Farber, M.D.             DATE OF BIRTH:  1950-07-25   DATE OF ADMISSION:  03/19/2003  DATE OF DISCHARGE:                                HISTORY & PHYSICAL   CHIEF COMPLAINT:  Abnormal Cardiolite/dyspnea on exertion.   HISTORY OF PRESENT ILLNESS:  Mr. Larusso is a 60 year old male with no known  history of coronary artery disease.  He had a remote catheterization about  15 years ago but he states that there was no intervention performed.  He has  a history of dyspnea on exertion that is longstanding and as part of his  evaluation he had a Cardiolite at the cardiology office.  He had an  adenosine Cardiolite that showed a prior inferobasal infarct with mild  periinfarct ischemia in the inferior wall with apical thinning and an EF of  29%.  Because of this a cardiac catheterization was scheduled.  Of note, he  also had an echocardiogram on February 13, 2003 that showed an EF of 35-45%  with moderate diffuse hypokinesis and concentric left ventricular  hypertrophy.   Mr. Kopka states that he does not generally get chest pain although he is  not very active.  Recently his activity has been further limited because of  foot surgery.  When he was in the hospital today in the holding area he had  a left throbbing chest pain.  This is the first episode he has ever had.  It  went up to a 4-5/10.  It resolved with two sublingual nitroglycerin.  He  occasionally gets a substernal chest pain described as a pressure that is  associated with bending over but this resolves as soon as he straightens up.  His EKG showed no acute ischemic changes with chest pain.   PAST MEDICAL HISTORY:  1. Prior cardiac catheterization, results not available, but no  intervention.  2. History of insulin-dependent diabetes.  3. Hypertension.  4. Hyperlipidemia.  5. Obesity.   SURGICAL HISTORY:  1. Left foot.  2. Catheterization.   SOCIAL HISTORY:  He lives in [place] with his wife and he is Education officer, environmental at [employer].  He has no history of alcohol, tobacco, or drug use.  He is fairly  noncompliant with his diet and does not exercise.   FAMILY HISTORY:  His mother died at age 43 of a stroke and his father died  at age 63 but had no heart disease.  He has no siblings.  He has an uncle  that died suddenly at a fairly young age and he is not sure if this was from  a heart attack or a stroke.  There is a strong family history of  hypertension.  ALLERGIES:  No known drug allergies.   MEDICATIONS:  1. Crestor 40 daily.  2. Lasix 40 mg two daily.  3. Aspirin 81 mg daily.  4. Lisinopril/hydrochlorothiazide 20/12.5 mg daily.  5. Caltrate two daily.  6. Centrum Silver daily.  7. Trazodone 100 mg q.h.s.  8. Actos 45 mg daily.  9. Humalog q.a.c. 10 units a.m., 12 units at lunch and supper.  10.      Lantus 10 units q.h.s.   REVIEW OF SYSTEMS:  Significant for chronic dyspnea on exertion and is  positive for orthopnea.  He snores and his wife states that he does quit  breathing at times at night.  He has questionable claudication symptoms as  he gets thigh pain and hip pain with ambulation.  He has arthralgias in his  joints as well as pain in his feet.  He denies any GI or GU symptoms.  Review of systems is otherwise negative.   PHYSICAL EXAMINATION:  VITAL SIGNS:  Blood pressure is 155/82, heart is 74  and regular, respirations are 16 and not labored.  He is afebrile.  GENERAL:  He is a well-developed, obese African-American male in no acute  distress.  HEENT:  He is normocephalic and atraumatic with pupils equal, round, and  reactive to light and accommodation.  Extraocular movements are intact.  Sclerae are clear.  NECK:  Supple and without  lymphadenopathy, thyromegaly, bruit, and no JVD is  noted.  CARDIOVASCULAR:  His heart is regular in rate and rhythm with a clear S1 and  S2 and no significant murmur, rub, or gallop is noted.  LUNGS:  Clear to auscultation bilaterally.  SKIN:  No rashes or lesions are noted.  ABDOMEN:  Firm and nontender with active bowel sounds.  EXTREMITIES:  There is no cyanosis, clubbing, or edema.  MUSCULOSKELETAL:  He has no joint deformities or effusions and no spine or  CVA tenderness is noted.  NEUROLOGIC:  He is alert and oriented with cranial nerves II-XII grossly  intact and strength is normal.   LABORATORY DATA:  Chest x-ray:  The heart size and mediastinal contours are  normal and the lungs are clear.   Laboratory values:  Hemoglobin 13.6, hematocrit 41.5, wbc's 5.2, platelets  217.  INR 0.9, PTT 30.  Sodium 140, potassium 4.5, chloride 103, CO2 31, BUN  34, creatinine 2.3, glucose 149, calcium 9.3.   ASSESSMENT AND PLAN:  1. Dyspnea on exertion.  The patient has multiple coronary risk factors.  He     is for a cardiac catheterization today and further cardiac evaluation     will be based on these results.  Additionally, he needs better control of     his blood pressure and blood sugars.  The patient is fairly noncompliant     with diet and it was emphasized to he and his wife that he needs to     control it as we are going to do everything we can to minimize damage to     his kidneys.  2. Renal insufficiency.  This appears to be at its lowest level now as in     the past six months he has had a creatinine between 2.5 and 3.5 with a     BUN that ranges from 37 to 62.  He is therefore lower than he has been in     several months here today.  He is on a bicarbonate drip according to the     pharmacy protocol to  minimize contrast nephropathy.  He may have either     hypertensive or diabetic damage to his kidneys or both.  A recent MRA did    not show any definite stenosis but these  results need to be reviewed by     the M.D.  3.     Hypertension.  We will add a low-dose beta blocker and see how this is     tolerated.  4. The patient is otherwise stable and is to be continued to follow closely     by Dr. Birdie Sons as well as Dr. Randa Evens.      Lavella Hammock, P.A. [facility]                  Salvadore Farber, M.D.    RG/MEDQ  D:  03/19/2003  T:  03/19/2003  Job:  [id number]   cc:   Valetta Mole. Swords, M.D. [facility]

## 2010-10-08 NOTE — Assessment & Plan Note (Signed)
[facility]                            CARDIOLOGY OFFICE NOTE   NAME:Willie [name], Willie [name]                       MRN:          [id number]  DATE:04/18/2006                            DOB:          December 07, 1950    REASON FOR VISIT:  This is a 60 year old African-American male patient  of Dr. Samule Ohm, who presents today for cardiac clearance before  undergoing gastric bypass surgery.  The surgery has not been scheduled  yet and he has not seen at surgeon at this time.  This is scheduled  April 24, 2006.  The patient has a history of coronary artery disease  dating back to 2004 at which time he had abnormal Cardiolite showing an  ejection fraction of 29% with inferior infarct and peri-infarct  ischemia.  Symptoms at that time were primarily exertional dyspnea.  Because of this he underwent cardiac catheterization and was found to  have a chronic total occlusion of his right coronary artery.  Severe  distal stenosis 90% of his distal circumflex, 40% mid left anterior  descending.  An left ventriculography was not done due to renal  insufficiency.  He has been treated medically and has done well since  then.  Followup 2-dimensional echocardiogram showed an ejection fraction  of 45 to 55%.  He has not been seen since May, 2007 at which time he was  doing well.  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  He denies any chest pain or dyspnea on exertion.  He occasionally will  get some orthopnea and extra fluid, which happened approximately two  weeks ago, but this resolved on its' own.  He is followed by Dr.  Darrick Penna for chronic renal insufficiency as well. He denies any  dizziness or presyncope, paroxysmal nocturnal dyspnea, or diaphoresis.   CURRENT MEDICATIONS:  1. AcipHex 20 mg daily.  2. Gabapentin 100 mg two in the morning, 300 mg two q.h.s.  3. Provax CV daily.  4. Vitality Mineral Complex with calcium daily.  5. Glyburide 2 mg daily.  6. Coreg 4 mg daily.  7. Isosorbide 30 mg daily.  8. Lasix 40 mg b.i.d.  9. Lisinopril 40 mg daily.  10.Calcitriol 0.5 mcg two daily.  11.Vytorin 10/20 daily.   PAST MEDICAL HISTORY:  Significant for chronic renal insufficiency,  hypertension, dyslipidemia, peripheral vascular disease, diabetes,  (which has been well controlled).   SOCIAL HISTORY:  He is married.  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  He has three grown children.  He is a nonsmoker.   REVIEW OF SYSTEMS:  CONSTITUTIONAL:  Negative for dizziness or  presyncopal signs or symptoms.  GASTROINTESTINAL:  dyspepsia, dysphagia,  nausea, vomiting, change in bowels or melena.  CARDIOPULMONARY:  See  history of present illness.   PHYSICAL EXAMINATION:  GENERAL APPEARANCE:  This is a pleasant 55-year-  old overweight African-American male in no acute distress.  VITAL SIGNS:  Blood pressure 136/84, pulse 73, weight 308.  NECK: Is without jugular venous distention or HJR, bruits or thyroid  enlargement.  LUNGS:  Clear anterior, posteriorly and  laterally.  HEART:  Regular rate and rhythm at 80 beats per minute.  Normal S1 and  S2.  No murmur, rub, bruit, thrill or heave noted.  Distant heart  sounds.  ABDOMEN:  Soft without organomegaly, masses, lesions or abnormal  tenderness.  EXTREMITIES:  Without cyanosis, clubbing or edema.  Good distal pulses.   CLINICAL DATA:  EKG shows normal sinus rhythm with nonspecific T wave  abnormalities.  QT is controlled, 462.   IMPRESSION:  1. Obesity for clearance for gastric bypass surgery.  2. Coronary artery disease with total right coronary artery, 90%      distal circumflex and 40% mid left anterior descending on      catheterization in October, 2004, after abnormal Cardiolite for      inferior infarct and peri-infarct ischemia.  3. Ischemic cardiomyopathy with ejection fraction 29% on Cardiolite.      Followup on echocardiogram was 45 to 55%.  4. Chronic renal  insufficiency followed by Dr. Darrick Penna.  5. Hypertension, better controlled.  6. Treated dyslipidemia.  7. Peripheral vascular disease, asymptomatic.  8. Diabetes mellitus.   PLAN AT THIS TIME:  Overall, the patient is doing quite well from a  cardiac standpoint.  We will order a stress Cardiolite to make sure  there is not any change in his ischemia from the left anterior  descending or circumflex territory.  I have also asked him to followup  with Dr. Darrick Penna concerning his renal insufficiency before undergoing  the surgery.  He will followup with Dr. Samule Ohm after his Cardiolite.      Jacolyn Reedy, PA-C  Electronically Signed      Jesse Sans. Daleen Squibb, MD, [facility]  Electronically Signed   ML/MedQ  DD: 04/18/2006  DT: 04/18/2006  Job #: [id number]   cc:   Fayrene Fearing L. Deterding, M.D.  Dr. Chales Abrahams, [facility]

## 2010-10-08 NOTE — Discharge Summary (Signed)
Willie [name], Willie [name]                          ACCOUNT NO.:  [id number]   MEDICAL RECORD NO.:  [id number]                   PATIENT TYPE:  OIB   LOCATION:  4734                                 FACILITY:  [facility]   PHYSICIAN:  Salvadore Farber, M.D.             DATE OF BIRTH:  1950/11/24   DATE OF ADMISSION:  03/19/2003  DATE OF DISCHARGE:                           DISCHARGE SUMMARY - REFERRING   PROCEDURES:  1. Cardiac catheterization.  2. Coronary arteriogram.  3. Left ventriculogram.   HOSPITAL COURSE:  Willie [name] is a 60 year old male with no known history of  coronary artery disease.  His primary care physician referred him to  cardiology for dyspnea on exertion.  He had a Cardiolite which was abnormal  and came to [facility] for an outpatient cardiac catheterization on  March 19, 2003.   The cardiac catheterization showed nonobstructive coronary artery disease in  the left main LAD.  The circumflex was a small vessel and had a 90% left  distally.  A branch of the OM had a 60% stenosis and the RCA was totalled in  the mid portion with TIMI 2 flow and bridging collaterals.  A right heart  catheterization was also performed for the shortness of breath and it showed  elevated pulmonary artery pressures at 48/32 with a mean of 38.  His wedge  was 23.  Dr. Samule Ohm reviewed the films and felt that he had severe stenosis  of the circumflex but because it was a small vessel not amenable to  percutaneous intervention, medical therapy was recommended.  Additionally,  he needs to continue to take his medications for CHF.   Later that evening after his bedrest was complete Willie [name] had some chest  pain.  It was unclear for the cause for it as his blood pressure was  elevated but his EKG was unchanged.  The pain started at rest.  He was  admitted overnight for observation.   Over the next 48 hours his blood pressure medications were titrated and  adjusted and he was seen by  cardiac rehab.  They went over CHF signs and  symptoms as well as MI and anginal signs and symptoms as well as cardiac  risk factor reduction and a walking program.  He will also be referred for  outpatient cardiac rehabilitation.  By March 21, 2003 Willie [name] was  having no more episodes of chest pain and he had no symptoms of volume  overload as well.   Willie [name] has a fairly recent history of renal insufficiency over the last  six months with a creatinine that peaked at 3.5.  The low since March 2004  was 2.4.  However, his diuretics were held and he was hydrated and so  postprocedure his BUN was 27 and creatinine was 2.3.  At this time his  diuretics will be restarted and he is to follow up in  the office with a BMET  next week.  Additionally, for better blood pressure control, his lisinopril  was increased.   Willie [name] had been on Coumadin since 1997 for a probable CVA.  Records are  not available at this time for review.  He is to follow up with his primary  care physician and Dr. Cato Mulligan will make the decision if he needs to be on  Coumadin.   By March 21, 2003 Willie [name] was ambulating without chest pain or  shortness of breath.  He was considered stable for discharge on March 21, 2003 with outpatient follow-up arranged.   LABORATORY VALUES:  Sodium 136, potassium 3.8, chloride 100, CO2 30, BUN 27,  creatinine 2.3, glucose 105.  Hemoglobin 13.5, hematocrit 39.4, wbc's 5.0,  platelets 196.   DISCHARGE CONDITION:  Improved.   DISCHARGE DIAGNOSES:  1. Dyspnea on exertion, elevated pulmonary pressures by cardiac     catheterization.  2. Coronary artery disease, medical therapy recommended.  3. Left ventricular dysfunction with an ejection fraction of 35-45% by     echocardiogram prior to admission.  4. Diabetes.  5. Hypertension.  6. Hyperlipidemia.  7. Obesity.  8. Intolerance to LIPITOR secondary to muscle aches.  9. Renal insufficiency.   DISCHARGE  INSTRUCTIONS:  1. His activity level is to be as tolerated and he is to being a walking     program.  2. He is to stick to a low fat diabetic diet.  3. He is to call our office for problems with the catheterization site.  4. He is to get a BMET next Thursday at our office.  5. He is to follow up with Dr. Cato Mulligan as scheduled.  6. He is to follow up with Dr. Samule Ohm and has a P.A. appointment on April 01, 2003 at 3 p.m.   DISCHARGE MEDICATIONS:  1. Crestor 40 mg daily.  2. Lasix 40 mg two tablets daily.  3. Aspirin 81 mg daily.  4. Caltrate two tablets daily.  5. Centrum Silver daily.  6. Trazodone 100 mg q.h.s.  7. Actos 45 mg daily.  8. Lantus 10 units q.h.s.  9. Humalog 10 units a.m., 12 units at lunch, and 12 units at supper daily.  10.      Lisinopril 40 mg daily.  11.      Nitroglycerin p.r.n.  12.      Lopressor 50 mg one-half tablet b.i.d.  13.      He is not to take lisinopril/hydrochlorothiazide.      Lavella Hammock, P.A. [facility]                  Salvadore Farber, M.D.    RG/MEDQ  D:  03/21/2003  T:  03/21/2003  Job:  [id number]   cc:   Valetta Mole. Swords, M.D. [facility]

## 2010-10-13 ENCOUNTER — Encounter (INDEPENDENT_AMBULATORY_CARE_PROVIDER_SITE_OTHER): Payer: BC Managed Care – PPO | Admitting: Vascular Surgery

## 2010-10-13 ENCOUNTER — Encounter (INDEPENDENT_AMBULATORY_CARE_PROVIDER_SITE_OTHER): Payer: BC Managed Care – PPO

## 2010-10-13 DIAGNOSIS — I739 Peripheral vascular disease, unspecified: Secondary | ICD-10-CM

## 2010-10-13 DIAGNOSIS — I70219 Atherosclerosis of native arteries of extremities with intermittent claudication, unspecified extremity: Secondary | ICD-10-CM

## 2010-10-13 NOTE — Procedures (Unsigned)
LOWER EXTREMITY ARTERIAL DUPLEX  INDICATION:  Bilateral lower extremity PVD.  HISTORY: Diabetes:  Previous to gastric bypass. Cardiac:  No. Hypertension:  Yes. Smoking:  No. Previous Surgery:  No previous arterial intervention.  SINGLE LEVEL ARTERIAL EXAM                         RIGHT                LEFT Brachial:               179                  171 Anterior tibial:        152                  133 Posterior tibial:       162                  145 Peroneal: Ankle/Brachial Index:   0.91                 0.81  LOWER EXTREMITY ARTERIAL DUPLEX EXAM  DUPLEX: 1. A greater than 75% stenosis by ratios of more than 4.1 present in     the right mid/distal superficial femoral artery, left proximal/mid     superficial femoral artery, and left mid superficial femoral artery     segments. 2.  Multiple 50% to 75% stenosis present in the right distal external     iliac, common femoral, profunda femoral, proximal and proximal/mid     segments of the superficial femoral artery and left mid superficial     femoral artery. 3. Due to acoustic shadowing and poor Doppler interrogation, there are     2 areas, 1 involving each superficial femoral artery  that a short     segment occlusion cannot be ruled out.   IMPRESSION: 1. Stenosis present, as noted above. 2. Bilateral ankle brachial indices are in the mild claudication     range.  ___________________________________________ Di Kindle. Edilia Bo, M.D.  SH/MEDQ  D:  10/13/2010  T:  10/13/2010  Job:  [id number]

## 2010-10-14 NOTE — Consult Note (Signed)
VASCULAR SURGERY CONSULTATION  Willie [name], Willie [name] DOB:  18-May-1951                                       10/13/2010 [id number]#:[id number]  I saw the patient in the office today in consultation concerning his peripheral vascular disease.  He was referred by Dr. Darrick Penna.  This is a pleasant 60 year old gentleman who has complained of some cramping in both legs which he has had for the last year.  This typically occurs at night and involves his calves and thighs bilaterally.  Symptoms are slightly more significant on the right side.  I do not get any clear-cut history of claudication although he states that his activity is fairly limited.  He does not do a lot of walking.  He denies any significant rest pain and has had no history of nonhealing wounds.  Of note, he does have problem with hammertoes and was being considered for surgery for this.  PAST MEDICAL HISTORY:  Significant for stage IV chronic kidney disease and he is followed by Dr. Darrick Penna.  In addition he has diabetes, hypertension, hypercholesterolemia.  He states he has a remote history of myocardial infarction.  He had no history of congestive heart failure and no history of COPD.  PAST SURGICAL HISTORY:  Significant for previous gastric bypass surgery 4 years ago and has done very well from that standpoint.  SOCIAL HISTORY:  He is married.  He has three children.  He is a Education officer, environmental. He does not smoke cigarettes.  FAMILY HISTORY:  There is no history of premature cardiovascular disease.  REVIEW OF SYSTEMS:  GENERAL:  He has had weight loss since his bypass surgery.  He is 6 feet 1 inches and 179 pounds. CARDIOVASCULAR:  He has had no chest pain, chest pressure, palpitations or arrhythmias.  He states that he had a stroke with left-sided weakness in 1997.  He denies any history of DVT or phlebitis. GI:  He does have a history of reflux. MUSCULOSKELETAL:  He does have joint pain and muscle  pain. NEUROLOGIC, PULMONARY, HEMATOLOGIC, GU, ENT, PSYCHIATRIC, INTEGUMENTARY: Review of systems is unremarkable and is documented on the medical history form in his chart.  PHYSICAL EXAMINATION:  General:  This is a pleasant 60 year old gentleman who appears his stated age.  Blood pressure 161/91, heart rate is 73, respiratory rate is 12.  HEENT:  Unremarkable.  Lungs:  Are clear bilaterally to auscultation without rales, rhonchi or wheezing. Cardiovascular Exam:  I do not detect any carotid bruits.  He has an irregular rate and rhythm.  He has palpable radial and femoral pulses bilaterally.  I can barely palpate a right popliteal pulse and he has a diminished right dorsalis pedis pulse.  Has a diminished left popliteal and left dorsalis pedis pulse.  I cannot palpate posterior tibial pulses.  Both feet appear adequately perfused without ischemic ulcers. Abdomen:  Soft and nontender.  I cannot appreciate an aneurysm.  He has normal-pitched bowel sounds.  Musculoskeletal Exam:  He has no major deformities or cyanosis.  Neurologic Exam:  He has no focal weakness or paresthesias.  SKIN:  There are no ulcers or rashes.  I have reviewed his records from Dr. Deterding's office.  He has stage IV chronic kidney disease.  In addition he has diabetic retinopathy and a history of some mild anemia.  Of note, with respect to his coronary artery disease,  he is followed at [facility].  He did have an arterial Doppler study in our office today, which I have independently interpreted.  His ankle brachial index is 91% on the right and 81% on the left.  I suspect that these ABIs are falsely elevated.  I also reviewed his duplex of his arteries and he does have evidence of superficial femoral artery occlusive disease bilaterally.  Based on his duplex findings and physical examination, he does have evidence of bilateral infrainguinal arterial occlusive disease.  His Doppler wave forms, however,  are actually triphasic on both sides, so his circulation is very reasonable.  I did explain that I do not think his cramps are related to his peripheral vascular disease.  Typically, we would recommend quinine water for patients with cramps as it seems to work quite well.  However, I have explained that I would want him to discuss this with Dr. Darrick Penna before considering this.  With respect to his infrainguinal arterial occlusive disease in that he is currently asymptomatic, I have simply encouraged him to get on a structured walking program.  Fortunately, he is not a smoker.  He is also already on cilostazol.  With respect to considering surgery on his toes, I would favor not having surgery on the toes if at all possible.  If he does require surgery, we could consider a CO2 arteriogram, although the imaging is not as good as the standard contrast arteriogram and we would only consider this if he feels that he needs to have surgery on the toes.  I will plan on seeing him back in 6 months and I ordered followup ABIs for that time.  Will also obtain toe pressures at that time to also predict healing if he ultimately required work on his toes.    Di Kindle. Edilia Bo, M.D. Electronically Signed CSD/MEDQ  D:  10/13/2010  T:  10/14/2010  Job:  [id number]  cc:   Dr. Berniece Salines [name]. Deterding, M.D.

## 2011-01-07 ENCOUNTER — Other Ambulatory Visit: Payer: Self-pay | Admitting: *Deleted

## 2011-01-07 MED ORDER — ISOSORBIDE MONONITRATE ER 30 MG PO TB24: 30.0000 mg | ORAL_TABLET | Freq: Every day | ORAL | Status: DC

## 2011-01-07 NOTE — Telephone Encounter (Signed)
RX SENT IN TODAY, PT NEEDS OV LAST SEEN 09. Willie [name]

## 2011-03-02 ENCOUNTER — Encounter: Payer: Self-pay | Admitting: Vascular Surgery

## 2011-03-06 ENCOUNTER — Other Ambulatory Visit: Payer: Self-pay | Admitting: Cardiology

## 2011-03-08 ENCOUNTER — Telehealth: Payer: Self-pay

## 2011-03-08 MED ORDER — ISOSORBIDE MONONITRATE ER 30 MG PO TB24: 30.0000 mg | ORAL_TABLET | Freq: Every day | ORAL | Status: DC

## 2011-03-08 NOTE — Telephone Encounter (Signed)
Refill sent for isosorbide mono 30 mg  

## 2011-04-04 ENCOUNTER — Other Ambulatory Visit: Payer: Self-pay | Admitting: Cardiology

## 2011-04-04 NOTE — Telephone Encounter (Signed)
Pt has not been seen since 01/02/2008. Was told then to follow-up in one month.

## 2011-04-12 ENCOUNTER — Encounter: Payer: Self-pay | Admitting: Vascular Surgery

## 2011-04-13 ENCOUNTER — Encounter: Payer: Self-pay | Admitting: Vascular Surgery

## 2011-04-13 ENCOUNTER — Encounter (INDEPENDENT_AMBULATORY_CARE_PROVIDER_SITE_OTHER): Payer: BC Managed Care – PPO | Admitting: *Deleted

## 2011-04-13 ENCOUNTER — Ambulatory Visit (INDEPENDENT_AMBULATORY_CARE_PROVIDER_SITE_OTHER): Payer: BC Managed Care – PPO | Admitting: Vascular Surgery

## 2011-04-13 VITALS — BP 157/84 | HR 55 | Resp 16 | Ht 74.0 in | Wt 177.0 lb

## 2011-04-13 DIAGNOSIS — I70219 Atherosclerosis of native arteries of extremities with intermittent claudication, unspecified extremity: Secondary | ICD-10-CM

## 2011-04-13 DIAGNOSIS — I70209 Unspecified atherosclerosis of native arteries of extremities, unspecified extremity: Secondary | ICD-10-CM

## 2011-04-13 NOTE — Progress Notes (Signed)
[facility] [place]  Patient name: Willie [name] MRN: [id number] DOB: 10-Jul-1950 Sex: male  CC: follow up of peripheral vascular disease  HPI: Willie [name] is a 60 y.o. male who I originally saw in consultation on 10/13/2010 with peripheral vascular disease. At that time he had an ABI of 91% on the right and 81% on the left. He did have evidence of superficial femoral artery occlusive disease bilaterally. However, I felt the circulation was quite reasonable we simply arrange for follow up visit in 6 months.  Since I saw him last he has mild claudication in both calves. He symptoms are brought on by ambulation and relieved with rest. He's had no thigh or hip claudication. Is difficult for him to quantitate the exact distance that he can walk but he does state he can walk a reasonable distance before experiencing symptoms. He's had no rest pain or nonhealing ulcers. Of note, he is considering surgery on his toes for hammertoes.  Past Medical History  Diagnosis Date  . Diabetes mellitus   . Hypertension   . Hyperlipidemia   . Myocardial infarction   . GERD (gastroesophageal reflux disease)   . Foot pain     while lying flat  . Chronic kidney disease   . Peripheral vascular disease   . Anemia   . Obesity     History reviewed. No pertinent family history.  SOCIAL HISTORY: History  Substance Use Topics  . Smoking status: Never Smoker   . Smokeless tobacco: Not on file  . Alcohol Use: No    No Known Allergies  Current Outpatient Prescriptions  Medication Sig Dispense Refill  . aspirin EC 81 MG tablet Take 81 mg by mouth daily.        . calcitRIOL (ROCALTROL) 0.5 MCG capsule Take 0.5 mcg by mouth 3 (three) times daily.       . cilostazol (PLETAL) 50 MG tablet Take 50 mg by mouth 2 (two) times daily.        . ergocalciferol (VITAMIN D2) 50000 UNITS capsule Take 50,000 Units by mouth once a week.        . furosemide (LASIX) 40 MG tablet Take 40 mg by mouth  daily.        . isosorbide dinitrate (ISORDIL) 30 MG tablet Take 30 mg by mouth daily.        . isosorbide mononitrate (IMDUR) 30 MG 24 hr tablet TAKE 1 TABLET BY MOUTH EVERY DAY  30 tablet  3  . lisinopril (PRINIVIL,ZESTRIL) 20 MG tablet Take 20 mg by mouth daily.        . metoprolol (LOPRESSOR) 50 MG tablet Take 50 mg by mouth 2 (two) times daily.        . traZODone (DESYREL) 100 MG tablet Take 100 mg by mouth at bedtime.        Marland Kitchen amLODipine (NORVASC) 5 MG tablet Take 5 mg by mouth daily.          REVIEW OF SYSTEMS: Arly.Keller ] denotes positive finding; [  ] denotes negative finding CARDIOVASCULAR:  [ ]  chest pain   [ ]  chest pressure   [ ]  palpitations   [ ]  orthopnea   [ ]  dyspnea on exertion   Arly.Keller ] claudication   [ ]  rest pain   [ ]  DVT   [ ]  phlebitis PULMONARY:   [ ]  productive cough   [ ]  asthma   [ ]  wheezing NEUROLOGIC:   [ ]  weakness  [ ]   paresthesias  [ ]  aphasia  [ ]  amaurosis  [ ]  dizziness HEMATOLOGIC:   [ ]  bleeding problems   [ ]  clotting disorders MUSCULOSKELETAL:  [ ]  joint pain   [ ]  joint swelling [ ]  leg swelling GASTROINTESTINAL: [ ]   blood in stool  [ ]   hematemesis GENITOURINARY:  [ ]   dysuria  [ ]   hematuria PSYCHIATRIC:  [ ]  history of major depression INTEGUMENTARY:  [ ]  rashes  [ ]  ulcers CONSTITUTIONAL:  [ ]  fever   [ ]  chills  PHYSICAL EXAM: Filed Vitals:   04/13/11 1153  BP: 157/84  Pulse: 55  Resp: 16  Height: 6\' 2"  (1.88 m)  Weight: 177 lb (80.287 kg)  SpO2: 100%   Body mass index is 22.73 kg/(m^2). GENERAL: The patient is a well-nourished male, in no acute distress. The vital signs are documented above. CARDIOVASCULAR: There is a regular rate and rhythm without significant murmur appreciated. I do not detect any carotid bruits. He has palpable femoral pulses bilaterally. I cannot palpate popliteal or pedal pulses. PULMONARY: There is good air exchange bilaterally without wheezing or rales. ABDOMEN: Soft and non-tender with normal pitched bowel sounds. I  do not palpate an abdominal aortic aneurysm. MUSCULOSKELETAL: There are no major deformities or cyanosis. NEUROLOGIC: No focal weakness or paresthesias are detected. SKIN: There are no ulcers or rashes noted. PSYCHIATRIC: The patient has a normal affect.  DATA:  I have independently interpreted his arterial Doppler study which shows a biphasic right posterior tibial signal and a monophasic dorsalis pedis signal. ABI on the right is 98%. Toe pressure on the right is 95 mm of mercury. On the left side he has a biphasic posterior tibial and dorsalis pedis signal. ABI is 91%. Toe pressure on the left is 114 mm of mercury.  MEDICAL ISSUES: With respect to having surgery on his toes, the toe pressure of 114 mmHg suggest adequate circulation for healing. Although his ABI of 90% may be falsely elevated, the toe pressure is typically much more predictive of healing. If he had problems with healing his toe surgery we would have to consider CO2 arteriography as he does have a history of chronic kidney disease. I've ordered follow up ABIs in one year now see him back at that time. He knows to call sooner if he has problems.  Magon Croson S [facility] [place] Office: [phone]

## 2012-03-07 ENCOUNTER — Telehealth: Payer: Self-pay | Admitting: Internal Medicine

## 2012-03-07 NOTE — Telephone Encounter (Signed)
Received 6 pages from [facility] Primary care at [facility] sent to Dr. Juanda Chance. 03/07/12/sd

## 2012-04-18 ENCOUNTER — Other Ambulatory Visit: Payer: BC Managed Care – PPO

## 2012-04-18 ENCOUNTER — Other Ambulatory Visit: Payer: Self-pay | Admitting: *Deleted

## 2012-04-18 ENCOUNTER — Ambulatory Visit: Payer: BC Managed Care – PPO | Admitting: Vascular Surgery

## 2012-04-18 DIAGNOSIS — I70219 Atherosclerosis of native arteries of extremities with intermittent claudication, unspecified extremity: Secondary | ICD-10-CM

## 2012-04-24 ENCOUNTER — Encounter: Payer: Self-pay | Admitting: Neurosurgery

## 2012-04-25 ENCOUNTER — Ambulatory Visit: Payer: BC Managed Care – PPO | Admitting: Neurosurgery

## 2012-05-23 DIAGNOSIS — Z9289 Personal history of other medical treatment: Secondary | ICD-10-CM

## 2012-05-23 HISTORY — DX: Personal history of other medical treatment: Z92.89

## 2012-08-02 ENCOUNTER — Other Ambulatory Visit: Payer: Self-pay | Admitting: *Deleted

## 2012-08-02 DIAGNOSIS — N184 Chronic kidney disease, stage 4 (severe): Secondary | ICD-10-CM

## 2012-08-02 DIAGNOSIS — Z48812 Encounter for surgical aftercare following surgery on the circulatory system: Secondary | ICD-10-CM

## 2012-08-07 ENCOUNTER — Encounter: Payer: Self-pay | Admitting: Vascular Surgery

## 2012-08-08 ENCOUNTER — Other Ambulatory Visit: Payer: Self-pay

## 2012-08-08 ENCOUNTER — Encounter (INDEPENDENT_AMBULATORY_CARE_PROVIDER_SITE_OTHER): Payer: BC Managed Care – PPO | Admitting: Vascular Surgery

## 2012-08-08 ENCOUNTER — Ambulatory Visit (INDEPENDENT_AMBULATORY_CARE_PROVIDER_SITE_OTHER): Payer: BC Managed Care – PPO | Admitting: Vascular Surgery

## 2012-08-08 ENCOUNTER — Encounter: Payer: Self-pay | Admitting: Vascular Surgery

## 2012-08-08 VITALS — BP 138/74 | HR 57 | Ht 74.0 in | Wt 177.2 lb

## 2012-08-08 DIAGNOSIS — N186 End stage renal disease: Secondary | ICD-10-CM

## 2012-08-08 DIAGNOSIS — Z01810 Encounter for preprocedural cardiovascular examination: Secondary | ICD-10-CM

## 2012-08-08 DIAGNOSIS — N184 Chronic kidney disease, stage 4 (severe): Secondary | ICD-10-CM

## 2012-08-08 DIAGNOSIS — Z48812 Encounter for surgical aftercare following surgery on the circulatory system: Secondary | ICD-10-CM

## 2012-08-08 NOTE — Assessment & Plan Note (Signed)
Does patient has stage IV chronic kidney disease and appears to be a reasonable candidate for a left arm AV fistula. It appears that the upper arm cephalic vein on the left looks reasonable.I have explained the indications for placement of an AV fistula or AV graft. I've explained that if at all possible we will place an AV fistula.  I have reviewed the risks of placement of an AV fistula including but not limited to: failure of the fistula to mature, need for subsequent interventions, and thrombosis. In addition I have reviewed the potential complications of placement of an AV graft. These risks include, but are not limited to, graft thrombosis, graft infection, wound healing problems, bleeding, arm swelling, and steal syndrome. All the patient's questions were answered and they are agreeable to proceed with surgery. The surgery is scheduled for 08/16/2012.

## 2012-08-08 NOTE — Progress Notes (Signed)
[facility] [place]  Patient name: Willie [name] MRN: [id number] DOB: 03/31/1951 Sex: male  REASON FOR VISIT: valuated for hemodialysis access. Referred by Dr. Darrick Penna  HPI: Willie [name] is a 62 y.o. male with stage IV chronic kidney disease secondary to hypertension and diabetes. He was referred for evaluation for hemodialysis access. He has had some fatigue. Otherwise he denies nausea, vomiting, anorexia, or palpitations. He is right-handed.  He does have a complicated medical history. I reviewed his records from [facility]. He had a myocardial infarction in the  1990s and a CVA in 1997. In addition he has secondary hyperparathyroidism and anemia.  Past Medical History  Diagnosis Date  . Diabetes mellitus   . Hypertension   . Hyperlipidemia   . Myocardial infarction   . GERD (gastroesophageal reflux disease)   . Foot pain     while lying flat  . Chronic kidney disease   . Peripheral vascular disease   . Anemia   . Obesity   . CAD (coronary artery disease)   . Stroke     Family History  Problem Relation Age of Onset  . Hypertension Mother   . Diabetes Father   . Other Father     amputation    SOCIAL HISTORY: History  Substance Use Topics  . Smoking status: Never Smoker   . Smokeless tobacco: Never Used  . Alcohol Use: No    No Known Allergies  Current Outpatient Prescriptions  Medication Sig Dispense Refill  . aspirin EC 81 MG tablet Take 81 mg by mouth daily.        . calcitRIOL (ROCALTROL) 0.5 MCG capsule Take 0.5 mcg by mouth daily.       . cilostazol (PLETAL) 50 MG tablet Take 50 mg by mouth 2 (two) times daily.        . furosemide (LASIX) 40 MG tablet Take 40 mg by mouth daily.        . isosorbide dinitrate (ISORDIL) 30 MG tablet Take 30 mg by mouth daily.        . isosorbide mononitrate (IMDUR) 30 MG 24 hr tablet TAKE 1 TABLET BY MOUTH EVERY DAY  30 tablet  3  . lisinopril (PRINIVIL,ZESTRIL) 20 MG tablet Take 20  mg by mouth daily.        . metoprolol (LOPRESSOR) 50 MG tablet Take 50 mg by mouth 2 (two) times daily.        . metoprolol succinate (TOPROL-XL) 50 MG 24 hr tablet Take 50 mg by mouth daily. Take with or immediately following a meal.      . sodium bicarbonate 650 MG tablet Take 650 mg by mouth 2 (two) times daily.      . traZODone (DESYREL) 100 MG tablet Take 100 mg by mouth at bedtime.        Marland Kitchen amLODipine (NORVASC) 5 MG tablet Take 5 mg by mouth daily.        . ergocalciferol (VITAMIN D2) 50000 UNITS capsule Take 50,000 Units by mouth once a week.         No current facility-administered medications for this visit.    REVIEW OF SYSTEMS: Arly.Keller ] denotes positive finding; [  ] denotes negative finding  CARDIOVASCULAR:  [ ]  chest pain   [ ]  chest pressure   [ ]  palpitations   [ ]  orthopnea   [ ]  dyspnea on exertion   [ ]  claudication   [ ]  rest pain   [ ]   DVT   [ ]  phlebitis PULMONARY:   [ ]  productive cough   [ ]  asthma   [ ]  wheezing NEUROLOGIC:   [ ]  weakness  [ ]  paresthesias  [ ]  aphasia  [ ]  amaurosis  [ ]  dizziness HEMATOLOGIC:   [ ]  bleeding problems   [ ]  clotting disorders MUSCULOSKELETAL:  [ ]  joint pain   [ ]  joint swelling Arly.Keller ] leg swelling GASTROINTESTINAL: [ ]   blood in stool  [ ]   hematemesis GENITOURINARY:  [ ]   dysuria  [ ]   hematuria PSYCHIATRIC:  [ ]  history of major depression INTEGUMENTARY:  [ ]  rashes  [ ]  ulcers CONSTITUTIONAL:  [ ]  fever   [ ]  chills  PHYSICAL EXAM: Filed Vitals:   08/08/12 1128  BP: 138/74  Pulse: 57  Height: 6\' 2"  (1.88 m)  Weight: 177 lb 3.2 oz (80.377 kg)  SpO2: 100%   Body mass index is 22.74 kg/(m^2). GENERAL: The patient is a well-nourished male, in no acute distress. The vital signs are documented above. CARDIOVASCULAR: There is a regular rate and rhythm. I do not detect carotid bruits. He has a normal right radial pulse. He has a slightly diminished left radial pulse. He has a strong brachial pulse on the left. PULMONARY: There is  good air exchange bilaterally without wheezing or rales. ABDOMEN: Soft and non-tender with normal pitched bowel sounds.  MUSCULOSKELETAL: There are no major deformities or cyanosis. NEUROLOGIC: No focal weakness or paresthesias are detected. SKIN: There are no ulcers or rashes noted. PSYCHIATRIC: The patient has a normal affect.  DATA:  I have independently interpreted his vein map which was done today. This shows that his upper arm cephalic vein in both arms looks reasonable. The basilic vein on the right looks reasonable. The basilic vein on the left is somewhat marginal.  MEDICAL ISSUES:  End stage renal disease Does patient has stage IV chronic kidney disease and appears to be a reasonable candidate for a left arm AV fistula. It appears that the upper arm cephalic vein on the left looks reasonable.I have explained the indications for placement of an AV fistula or AV graft. I've explained that if at all possible we will place an AV fistula.  I have reviewed the risks of placement of an AV fistula including but not limited to: failure of the fistula to mature, need for subsequent interventions, and thrombosis. In addition I have reviewed the potential complications of placement of an AV graft. These risks include, but are not limited to, graft thrombosis, graft infection, wound healing problems, bleeding, arm swelling, and steal syndrome. All the patient's questions were answered and they are agreeable to proceed with surgery. The surgery is scheduled for 08/16/2012.    Kaylenn Civil S [facility] [place] Beeper: [phone]

## 2012-08-10 ENCOUNTER — Encounter (HOSPITAL_COMMUNITY): Payer: Self-pay | Admitting: Respiratory Therapy

## 2012-08-14 ENCOUNTER — Encounter (HOSPITAL_COMMUNITY): Payer: Self-pay

## 2012-08-14 ENCOUNTER — Encounter (HOSPITAL_COMMUNITY)
Admission: RE | Admit: 2012-08-14 | Discharge: 2012-08-14 | Disposition: A | Discharge status: Home / Self Care | Payer: BC Managed Care – PPO | Source: Ambulatory Visit | Attending: Anesthesiology | Admitting: Anesthesiology

## 2012-08-14 ENCOUNTER — Encounter (HOSPITAL_COMMUNITY)
Admission: RE | Admit: 2012-08-14 | Discharge: 2012-08-14 | Disposition: A | Discharge status: Home / Self Care | Payer: BC Managed Care – PPO | Source: Ambulatory Visit | Attending: Vascular Surgery | Admitting: Vascular Surgery

## 2012-08-14 HISTORY — DX: Personal history of other medical treatment: Z92.89

## 2012-08-14 HISTORY — DX: Unspecified osteoarthritis, unspecified site: M19.90

## 2012-08-14 LAB — BASIC METABOLIC PANEL
BUN: 41 mg/dL — ABNORMAL HIGH (ref 6–23)
CO2: 27 mEq/L (ref 19–32)
Calcium: 8.6 mg/dL (ref 8.4–10.5)
Chloride: 106 mEq/L (ref 96–112)
Creatinine, Ser: 4.12 mg/dL — ABNORMAL HIGH (ref 0.50–1.35)
GFR calc Af Amer: 17 mL/min — ABNORMAL LOW (ref >90)

## 2012-08-14 LAB — SURGICAL PCR SCREEN: MRSA, PCR: NEGATIVE

## 2012-08-14 LAB — CBC
HCT: 37.5 % — ABNORMAL LOW (ref 39.0–52.0)
MCH: 30.3 pg (ref 26.0–34.0)
MCV: 92.4 fL (ref 78.0–100.0)
Platelets: 121 10*3/uL — ABNORMAL LOW (ref 150–400)
RDW: 12.9 % (ref 11.5–15.5)
WBC: 3.5 10*3/uL — ABNORMAL LOW (ref 4.0–10.5)

## 2012-08-14 NOTE — Pre-Procedure Instructions (Signed)
AYDIAN DIMMICK  08/14/2012   Your procedure is scheduled on:  Thursday, March 27th  Report to [facility] at 0530 AM.  Call this number if you have problems the morning of surgery: [phone]   Remember:   Do not eat food or drink liquids after midnight.    Take these medicines the morning of surgery with A SIP OF WATER: Lopressor, Imdur   Do not wear jewelry, make-up or nail polish.  Do not wear lotions, powders, or perfumes,deodorant.  Do not shave 48 hours prior to surgery. Men may shave face and neck.  Do not bring valuables to the hospital.  Contacts, dentures or bridgework may not be worn into surgery.  Leave suitcase in the car. After surgery it may be brought to your room.  For patients admitted to the hospital, checkout time is 11:00 AM the day of discharge.   Patients discharged the day of surgery will not be allowed to drive home.    Special Instructions: Shower using CHG 2 nights before surgery and the night before surgery.  If you shower the day of surgery use CHG.  Use special wash - you have one bottle of CHG for all showers.  You should use approximately 1/3 of the bottle for each shower.   Please read over the following fact sheets that you were given: Pain Booklet, Coughing and Deep Breathing, MRSA Information and Surgical Site Infection Prevention

## 2012-08-14 NOTE — Progress Notes (Signed)
Call to [facility] in [place], requested records for pt. Request from their office was that we fax a request. Fax was made to [phone]

## 2012-08-14 NOTE — Progress Notes (Signed)
Call to A. Zelenak,PAC, reported pt. 's remark that he is scheduled for an ICD on 09/19/2012, awaiting notes fr. [facility] in [place].

## 2012-08-14 NOTE — Progress Notes (Signed)
Request made to [facility] for recent note/summary on this pt.

## 2012-08-15 MED ORDER — DEXTROSE 5 % IV SOLN
1.5000 g | INTRAVENOUS | Status: AC
  Administered 2012-08-16: 1.5 g via INTRAVENOUS
  Filled 2012-08-15: qty 1.5

## 2012-08-15 NOTE — Progress Notes (Signed)
Anesthesia chart review: Patient is a 62 year old male scheduled for creation of left arm arteriovenous fistula by Dr. Edilia Bo on 08/16/12. He has not yet on hemodialysis. 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  PCP is Dr. Riley Lam [place].  Nephrologist is Dr. Darrick Penna.  Cardiologist is Dr. Pierre Bali in [place]. He was also recently evaluated by EP Cardiologist Dr. Magnus Sinning regarding consideration of ICD.  He was felt to be a candidate for AICD, but not CRT.  A subQ ICD was felt to be the best option given infection risk if started on hemodialysis--however, it was denied by his insurance company.  I think he is ultimately planning to have traditional AICD implantation.  Nuclear stress test on 07/31/12 showed a medium size, severe fixed perfusion defect consistent with infarction involving the basal and mid inferior/inferolateral segment with no peri-infarct ischemia. Findings are suggestive of obstructive disease involving the right coronary artery. EF 35% with moderate hypokinesis of basal/mid inferior wall. Compared to his previous study dated February 2011, there was no significant change. Overall the study was felt low risk.  Echo on 06/11/12 showed 1. Left ventricle is mildly dilated with moderately reduced function with EF of 30%. There is evidence of inferior, inferoseptal, and inferolateral akinesis. 2. Right ventricle is normal in size and function. 3. Mild mitral regurgitation. 4. Mild tricuspid regurgitation. 5. Moderate pulmonary hypertension with RVSP of 45-50 mmHg. 6. Moderate aortic root dilatation at 4.3 cm.  Cardiac cath on 03/19/03 showed: 1. Left main: Angiographically normal.  2. LAD: Moderate sized vessel giving rise to three small diagonals. There is a 40% stenosis at the mid vessel.  3. Circumflex:  Large vessel giving rise to a single large branching marginal. The distal circumflex has a 90% stenosis in approximately 2.0 - 2.25 mm vessel. The upper division of the branching obtuse marginal has a 60% proximal stenosis.  4. RCA: Occluded in its mid section with distal vessel filling faintly via bridging collaterals as well as collaterals from the left.  Medical therapy was recommended at that time.  EKG on 05/24/12 showed SB @ 58 bpm, low voltage in limb leads, incomplete left BBB, non-specific T wave abnormality.    CXR on 08/14/12 showed no edema or consolidation.  Pre-operative labs noted. Plan for ISTAT day of surgery.  I updated Anesthesiologist Dr. Katrinka Blazing about cardiac history and subsequently called and spoke with Dr. Carole Binning today. He felt since patient had not had any documented arrhythmias and his AICD was for primary prevention only that patient would be "okay to proceed" with AVF.  He also stated that his nurse had spoken with EP cardiologist Dr. Raynald Kemp who indicated patient's AICD insertion date was actually pushed back to 09/19/12 to allow time for recovery for HD access surgery.  Velna Ochs [facility]Anesthesiology Phone [phone] 08/15/2012 12:24 PM

## 2012-08-16 ENCOUNTER — Encounter (HOSPITAL_COMMUNITY)
Admission: RE | Disposition: A | Discharge status: Home / Self Care | Payer: Self-pay | Source: Ambulatory Visit | Attending: Vascular Surgery

## 2012-08-16 ENCOUNTER — Ambulatory Visit (HOSPITAL_COMMUNITY)
Admission: RE | Admit: 2012-08-16 | Discharge: 2012-08-16 | Disposition: A | Discharge status: Home / Self Care | Payer: BC Managed Care – PPO | Source: Ambulatory Visit | Attending: Vascular Surgery | Admitting: Vascular Surgery

## 2012-08-16 ENCOUNTER — Telehealth: Payer: Self-pay | Admitting: Vascular Surgery

## 2012-08-16 ENCOUNTER — Encounter (HOSPITAL_COMMUNITY): Payer: Self-pay | Admitting: *Deleted

## 2012-08-16 ENCOUNTER — Encounter (HOSPITAL_COMMUNITY): Payer: Self-pay | Admitting: Vascular Surgery

## 2012-08-16 ENCOUNTER — Ambulatory Visit (HOSPITAL_COMMUNITY): Payer: BC Managed Care – PPO | Admitting: Anesthesiology

## 2012-08-16 DIAGNOSIS — E1129 Type 2 diabetes mellitus with other diabetic kidney complication: Secondary | ICD-10-CM | POA: Insufficient documentation

## 2012-08-16 DIAGNOSIS — E669 Obesity, unspecified: Secondary | ICD-10-CM | POA: Insufficient documentation

## 2012-08-16 DIAGNOSIS — Z8673 Personal history of transient ischemic attack (TIA), and cerebral infarction without residual deficits: Secondary | ICD-10-CM | POA: Insufficient documentation

## 2012-08-16 DIAGNOSIS — N186 End stage renal disease: Secondary | ICD-10-CM

## 2012-08-16 DIAGNOSIS — Z7982 Long term (current) use of aspirin: Secondary | ICD-10-CM | POA: Insufficient documentation

## 2012-08-16 DIAGNOSIS — I120 Hypertensive chronic kidney disease with stage 5 chronic kidney disease or end stage renal disease: Secondary | ICD-10-CM | POA: Insufficient documentation

## 2012-08-16 DIAGNOSIS — D649 Anemia, unspecified: Secondary | ICD-10-CM | POA: Insufficient documentation

## 2012-08-16 DIAGNOSIS — K219 Gastro-esophageal reflux disease without esophagitis: Secondary | ICD-10-CM | POA: Insufficient documentation

## 2012-08-16 DIAGNOSIS — E785 Hyperlipidemia, unspecified: Secondary | ICD-10-CM | POA: Insufficient documentation

## 2012-08-16 DIAGNOSIS — I2510 Atherosclerotic heart disease of native coronary artery without angina pectoris: Secondary | ICD-10-CM | POA: Insufficient documentation

## 2012-08-16 DIAGNOSIS — Z79899 Other long term (current) drug therapy: Secondary | ICD-10-CM | POA: Insufficient documentation

## 2012-08-16 DIAGNOSIS — I739 Peripheral vascular disease, unspecified: Secondary | ICD-10-CM | POA: Insufficient documentation

## 2012-08-16 HISTORY — PX: AV FISTULA PLACEMENT: SHX1204

## 2012-08-16 HISTORY — PX: LIGATION OF COMPETING BRANCHES OF ARTERIOVENOUS FISTULA: SHX5949

## 2012-08-16 LAB — POCT I-STAT 4, (NA,K, GLUC, HGB,HCT)
Glucose, Bld: 86 mg/dL (ref 70–99)
Hemoglobin: 14.6 g/dL (ref 13.0–17.0)
Potassium: 4.1 mEq/L (ref 3.5–5.1)

## 2012-08-16 SURGERY — ARTERIOVENOUS (AV) FISTULA CREATION
Anesthesia: Monitor Anesthesia Care | Site: Arm Upper | Laterality: Left | Wound class: Clean

## 2012-08-16 MED ORDER — LIDOCAINE-EPINEPHRINE (PF) 1 %-1:200000 IJ SOLN
INTRAMUSCULAR | Status: DC | PRN
  Administered 2012-08-16: 13 mL

## 2012-08-16 MED ORDER — OXYCODONE-ACETAMINOPHEN 5-325 MG PO TABS: 1.0000 | ORAL_TABLET | ORAL | Status: DC | PRN

## 2012-08-16 MED ORDER — PROPOFOL INFUSION 10 MG/ML OPTIME
INTRAVENOUS | Status: DC | PRN
  Administered 2012-08-16: 100 ug/kg/min via INTRAVENOUS

## 2012-08-16 MED ORDER — SODIUM CHLORIDE 0.9 % IV SOLN
INTRAVENOUS | Status: DC | PRN
  Administered 2012-08-16: 07:00:00 via INTRAVENOUS

## 2012-08-16 MED ORDER — HEPARIN SODIUM (PORCINE) 1000 UNIT/ML IJ SOLN
INTRAMUSCULAR | Status: DC | PRN
  Administered 2012-08-16: 7000 [IU] via INTRAVENOUS

## 2012-08-16 MED ORDER — MIDAZOLAM HCL 5 MG/5ML IJ SOLN
INTRAMUSCULAR | Status: DC | PRN
  Administered 2012-08-16: 2 mg via INTRAVENOUS

## 2012-08-16 MED ORDER — FENTANYL CITRATE 0.05 MG/ML IJ SOLN
INTRAMUSCULAR | Status: DC | PRN
  Administered 2012-08-16: 50 ug via INTRAVENOUS

## 2012-08-16 MED ORDER — 0.9 % SODIUM CHLORIDE (POUR BTL) OPTIME
TOPICAL | Status: DC | PRN
  Administered 2012-08-16: 1000 mL

## 2012-08-16 MED ORDER — PROTAMINE SULFATE 10 MG/ML IV SOLN
INTRAVENOUS | Status: DC | PRN
  Administered 2012-08-16: 30 mg via INTRAVENOUS

## 2012-08-16 MED ORDER — ONDANSETRON HCL 4 MG/2ML IJ SOLN: 4.0000 mg | Freq: Once | INTRAMUSCULAR | Status: DC | PRN

## 2012-08-16 MED ORDER — SODIUM CHLORIDE 0.9 % IV SOLN: INTRAVENOUS | Status: DC

## 2012-08-16 MED ORDER — SODIUM CHLORIDE 0.9 % IR SOLN
Status: DC | PRN
  Administered 2012-08-16: 08:00:00

## 2012-08-16 MED ORDER — LIDOCAINE-EPINEPHRINE (PF) 1 %-1:200000 IJ SOLN
INTRAMUSCULAR | Status: AC
  Filled 2012-08-16: qty 10

## 2012-08-16 MED ORDER — HYDROMORPHONE HCL PF 1 MG/ML IJ SOLN: 0.2500 mg | INTRAMUSCULAR | Status: DC | PRN

## 2012-08-16 SURGICAL SUPPLY — 40 items
ADH SKN CLS APL DERMABOND .7 (GAUZE/BANDAGES/DRESSINGS) ×1
CANISTER SUCTION 2500CC (MISCELLANEOUS) ×2 IMPLANT
CLIP TI MEDIUM 6 (CLIP) ×2 IMPLANT
CLIP TI WIDE RED SMALL 6 (CLIP) ×2 IMPLANT
CLOTH BEACON ORANGE TIMEOUT ST (SAFETY) ×2 IMPLANT
COVER PROBE W GEL 5X96 (DRAPES) ×2 IMPLANT
COVER SURGICAL LIGHT HANDLE (MISCELLANEOUS) ×2 IMPLANT
DECANTER SPIKE VIAL GLASS SM (MISCELLANEOUS) ×2 IMPLANT
DERMABOND ADVANCED (GAUZE/BANDAGES/DRESSINGS) ×1
DERMABOND ADVANCED .7 DNX12 (GAUZE/BANDAGES/DRESSINGS) ×1 IMPLANT
DRAIN PENROSE 1/2X12 LTX STRL (WOUND CARE) IMPLANT
ELECT REM PT RETURN 9FT ADLT (ELECTROSURGICAL) ×2
ELECTRODE REM PT RTRN 9FT ADLT (ELECTROSURGICAL) ×1 IMPLANT
GLOVE BIO SURGEON STRL SZ7.5 (GLOVE) ×2 IMPLANT
GLOVE BIOGEL PI IND STRL 7.0 (GLOVE) IMPLANT
GLOVE BIOGEL PI IND STRL 7.5 (GLOVE) IMPLANT
GLOVE BIOGEL PI IND STRL 8 (GLOVE) ×1 IMPLANT
GLOVE BIOGEL PI INDICATOR 7.0 (GLOVE) ×2
GLOVE BIOGEL PI INDICATOR 7.5 (GLOVE) ×2
GLOVE BIOGEL PI INDICATOR 8 (GLOVE) ×1
GLOVE ECLIPSE 6.5 STRL STRAW (GLOVE) ×1 IMPLANT
GLOVE SS BIOGEL STRL SZ 7 (GLOVE) IMPLANT
GLOVE SUPERSENSE BIOGEL SZ 7 (GLOVE) ×1
GOWN PREVENTION PLUS XXLARGE (GOWN DISPOSABLE) ×1 IMPLANT
GOWN STRL NON-REIN LRG LVL3 (GOWN DISPOSABLE) ×4 IMPLANT
KIT BASIN OR (CUSTOM PROCEDURE TRAY) ×2 IMPLANT
KIT ROOM TURNOVER OR (KITS) ×2 IMPLANT
NS IRRIG 1000ML POUR BTL (IV SOLUTION) ×2 IMPLANT
PACK CV ACCESS (CUSTOM PROCEDURE TRAY) ×2 IMPLANT
PAD ARMBOARD 7.5X6 YLW CONV (MISCELLANEOUS) ×4 IMPLANT
SPONGE GAUZE 4X4 12PLY (GAUZE/BANDAGES/DRESSINGS) ×2 IMPLANT
SPONGE SURGIFOAM ABS GEL 100 (HEMOSTASIS) IMPLANT
SUT PROLENE 6 0 BV (SUTURE) ×2 IMPLANT
SUT VIC AB 3-0 SH 27 (SUTURE) ×2
SUT VIC AB 3-0 SH 27X BRD (SUTURE) ×1 IMPLANT
SUT VICRYL 4-0 PS2 18IN ABS (SUTURE) ×2 IMPLANT
TOWEL OR 17X24 6PK STRL BLUE (TOWEL DISPOSABLE) ×2 IMPLANT
TOWEL OR 17X26 10 PK STRL BLUE (TOWEL DISPOSABLE) ×2 IMPLANT
UNDERPAD 30X30 INCONTINENT (UNDERPADS AND DIAPERS) ×2 IMPLANT
WATER STERILE IRR 1000ML POUR (IV SOLUTION) ×2 IMPLANT

## 2012-08-16 NOTE — Preoperative (Addendum)
Beta Blockers   Reason not to administer Beta Blockers:Metoprolol taken 0400 hrs 08/16/12

## 2012-08-16 NOTE — Anesthesia Preprocedure Evaluation (Signed)
Anesthesia Evaluation  Patient identified by MRN, date of birth, ID band Patient awake    Reviewed: Allergy & Precautions, H&P , NPO status , Patient's Chart, lab work & pertinent test results  Airway Mallampati: I TM Distance: >3 FB Neck ROM: full    Dental   Pulmonary          Cardiovascular hypertension, + CAD, + Past MI, + Peripheral Vascular Disease and +CHF Rhythm:regular Rate:Normal     Neuro/Psych CVA    GI/Hepatic GERD-  ,  Endo/Other  diabetes  Renal/GU      Musculoskeletal   Abdominal   Peds  Hematology   Anesthesia Other Findings   Reproductive/Obstetrics                           Anesthesia Physical Anesthesia Plan  ASA: III  Anesthesia Plan: MAC   Post-op Pain Management:    Induction: Intravenous  Airway Management Planned: Simple Face Mask  Additional Equipment:   Intra-op Plan:   Post-operative Plan:   Informed Consent: I have reviewed the patients History and Physical, chart, labs and discussed the procedure including the risks, benefits and alternatives for the proposed anesthesia with the patient or authorized representative who has indicated his/her understanding and acceptance.     Plan Discussed with: CRNA, Anesthesiologist and Surgeon  Anesthesia Plan Comments:         Anesthesia Quick Evaluation

## 2012-08-16 NOTE — Telephone Encounter (Addendum)
Message copied by Rosalyn Charters on Thu Aug 16, 2012 10:44 AM ------      Message from: Phillips Odor      Created: Thu Aug 16, 2012 10:22 AM      Regarding: FW: charge and f/u       Pls. Schedule 6 wk f/u with [facility]            ----- Message -----         From: Chuck Hint, MD         Sent: 08/16/2012   9:12 AM           To: Reuel Derby, Melene Plan, RN, #      Subject: charge and f/u                                           This patient had a left brachiocephalic AV fistula and ligation of one competing branch. Assistant Lianne Cure. He needs a follow up visit in 6 weeks to check on the maturation of this fistula.      Thanks      [facility]                   ------  notified patient of fu appt. with dr. Edilia Bo on 09-26-12 2PM

## 2012-08-16 NOTE — Interval H&P Note (Signed)
History and Physical Interval Note:  08/16/2012 7:09 AM  Willie [name]  has presented today for surgery, with the diagnosis of End Stage Renal Disease  The various methods of treatment have been discussed with the patient and family. After consideration of risks, benefits and other options for treatment, the patient has consented to  Procedure(s): ARTERIOVENOUS (AV) FISTULA CREATION (Left) as a surgical intervention .  The patient's history has been reviewed, patient examined, no change in status, stable for surgery.  I have reviewed the patient's chart and labs.  Questions were answered to the patient's satisfaction.     [name]

## 2012-08-16 NOTE — Transfer of Care (Signed)
Immediate Anesthesia Transfer of Care Note  Patient: Willie [name]  Procedure(s) Performed: Procedure(s): ARTERIOVENOUS (AV) FISTULA CREATION (Left) LIGATION OF COMPETING BRANCHES OF ARTERIOVENOUS FISTULA (Left)  Patient Location: PACU  Anesthesia Type:MAC  Level of Consciousness: sedated  Airway & Oxygen Therapy: Patient Spontanous Breathing and Patient connected to nasal cannula oxygen  Post-op Assessment: Report given to PACU RN, Post -op Vital signs reviewed and stable and Patient moving all extremities  Post vital signs: Reviewed and stable  Complications: No apparent anesthesia complications

## 2012-08-16 NOTE — H&P (View-Only) (Signed)
[facility] [place]  Patient name: Willie [name] MRN: [id number] DOB: 04/01/1951 Sex: male  REASON FOR VISIT: valuated for hemodialysis access. Referred by Dr. Deterding  HPI: Willie [name] is a 61 y.o. male with stage IV chronic kidney disease secondary to hypertension and diabetes. He was referred for evaluation for hemodialysis access. He has had some fatigue. Otherwise he denies nausea, vomiting, anorexia, or palpitations. He is right-handed.  He does have a complicated medical history. I reviewed his records from [facility]. He had a myocardial infarction in the  1990s and a CVA in 1997. In addition he has secondary hyperparathyroidism and anemia.  Past Medical History  Diagnosis Date  . Diabetes mellitus   . Hypertension   . Hyperlipidemia   . Myocardial infarction   . GERD (gastroesophageal reflux disease)   . Foot pain     while lying flat  . Chronic kidney disease   . Peripheral vascular disease   . Anemia   . Obesity   . CAD (coronary artery disease)   . Stroke     Family History  Problem Relation Age of Onset  . Hypertension Mother   . Diabetes Father   . Other Father     amputation    SOCIAL HISTORY: History  Substance Use Topics  . Smoking status: Never Smoker   . Smokeless tobacco: Never Used  . Alcohol Use: No    No Known Allergies  Current Outpatient Prescriptions  Medication Sig Dispense Refill  . aspirin EC 81 MG tablet Take 81 mg by mouth daily.        . calcitRIOL (ROCALTROL) 0.5 MCG capsule Take 0.5 mcg by mouth daily.       . cilostazol (PLETAL) 50 MG tablet Take 50 mg by mouth 2 (two) times daily.        . furosemide (LASIX) 40 MG tablet Take 40 mg by mouth daily.        . isosorbide dinitrate (ISORDIL) 30 MG tablet Take 30 mg by mouth daily.        . isosorbide mononitrate (IMDUR) 30 MG 24 hr tablet TAKE 1 TABLET BY MOUTH EVERY DAY  30 tablet  3  . lisinopril (PRINIVIL,ZESTRIL) 20 MG tablet Take 20  mg by mouth daily.        . metoprolol (LOPRESSOR) 50 MG tablet Take 50 mg by mouth 2 (two) times daily.        . metoprolol succinate (TOPROL-XL) 50 MG 24 hr tablet Take 50 mg by mouth daily. Take with or immediately following a meal.      . sodium bicarbonate 650 MG tablet Take 650 mg by mouth 2 (two) times daily.      . traZODone (DESYREL) 100 MG tablet Take 100 mg by mouth at bedtime.        . amLODipine (NORVASC) 5 MG tablet Take 5 mg by mouth daily.        . ergocalciferol (VITAMIN D2) 50000 UNITS capsule Take 50,000 Units by mouth once a week.         No current facility-administered medications for this visit.    REVIEW OF SYSTEMS: [X ] denotes positive finding; [  ] denotes negative finding  CARDIOVASCULAR:  [ ] chest pain   [ ] chest pressure   [ ] palpitations   [ ] orthopnea   [ ] dyspnea on exertion   [ ] claudication   [ ] rest pain   [ ]   DVT   [ ] phlebitis PULMONARY:   [ ] productive cough   [ ] asthma   [ ] wheezing NEUROLOGIC:   [ ] weakness  [ ] paresthesias  [ ] aphasia  [ ] amaurosis  [ ] dizziness HEMATOLOGIC:   [ ] bleeding problems   [ ] clotting disorders MUSCULOSKELETAL:  [ ] joint pain   [ ] joint swelling [X ] leg swelling GASTROINTESTINAL: [ ]  blood in stool  [ ]  hematemesis GENITOURINARY:  [ ]  dysuria  [ ]  hematuria PSYCHIATRIC:  [ ] history of major depression INTEGUMENTARY:  [ ] rashes  [ ] ulcers CONSTITUTIONAL:  [ ] fever   [ ] chills  PHYSICAL EXAM: Filed Vitals:   08/08/12 1128  BP: 138/74  Pulse: 57  Height: 6' 2" (1.88 m)  Weight: 177 lb 3.2 oz (80.377 kg)  SpO2: 100%   Body mass index is 22.74 kg/(m^2). GENERAL: The patient is a well-nourished male, in no acute distress. The vital signs are documented above. CARDIOVASCULAR: There is a regular rate and rhythm. I do not detect carotid bruits. He has a normal right radial pulse. He has a slightly diminished left radial pulse. He has a strong brachial pulse on the left. PULMONARY: There is  good air exchange bilaterally without wheezing or rales. ABDOMEN: Soft and non-tender with normal pitched bowel sounds.  MUSCULOSKELETAL: There are no major deformities or cyanosis. NEUROLOGIC: No focal weakness or paresthesias are detected. SKIN: There are no ulcers or rashes noted. PSYCHIATRIC: The patient has a normal affect.  DATA:  I have independently interpreted his vein map which was done today. This shows that his upper arm cephalic vein in both arms looks reasonable. The basilic vein on the right looks reasonable. The basilic vein on the left is somewhat marginal.  MEDICAL ISSUES:  End stage renal disease Does patient has stage IV chronic kidney disease and appears to be a reasonable candidate for a left arm AV fistula. It appears that the upper arm cephalic vein on the left looks reasonable.I have explained the indications for placement of an AV fistula or AV graft. I've explained that if at all possible we will place an AV fistula.  I have reviewed the risks of placement of an AV fistula including but not limited to: failure of the fistula to mature, need for subsequent interventions, and thrombosis. In addition I have reviewed the potential complications of placement of an AV graft. These risks include, but are not limited to, graft thrombosis, graft infection, wound healing problems, bleeding, arm swelling, and steal syndrome. All the patient's questions were answered and they are agreeable to proceed with surgery. The surgery is scheduled for 08/16/2012.    Arianah Torgeson S [facility] [place] Beeper: [phone]    

## 2012-08-16 NOTE — Op Note (Signed)
NAME: Willie [name]   MRN: [id number] DOB: 1950/07/31    DATE OF OPERATION: 08/16/2012  PREOP DIAGNOSIS: Chronic kidney disease  POSTOP DIAGNOSIS: Same  PROCEDURE:  1. Left brachial cephalic AV fistula 2. Ligation of one competing branch.  SURGEON: Di Kindle. Edilia Bo, MD, FACS  ASSIST: Lianne Cure PA  ANESTHESIA: local with sedation   EBL: minimal  INDICATIONS: [name] is a 62 y.o. male who is not yet on dialysis. He comes in for new access.   FINDINGS: he had a diminished left radial pulse in the forearm cephalic vein branched in the midforearm into several branches. Therefore I placed an upper arm brachiocephalic fistula. The vein was 5 mm.  TECHNIQUE: The patient was brought to the operating room and sedated by anesthesia. The left upper extremity was prepped and draped in the usual sterile fashion. After the skin was infiltrated with 1% lidocaine, a transverse incision was made just above the antecubital level. The brachial artery was dissected free beneath the fascia. It was a 4 mm artery with no significant plaque. The cephalic vein was dissected free and ligated distally. It irrigated up nicely with heparinized saline. The patient was heparinized. The brachial artery was clamped proximally and distally and a longitudinal arteriotomy was made. The vein was sewn end to side to the artery using continuous 6-0 Prolene suture. At completion was an excellent thrill in the fistula. There was a good radial and ulnar signal with the Doppler. I had identified one large competing branches in the mid upper arm. After the skin was anesthetized, a transverse incision was made over this area. The branch was dissected free and ligated with a 2-0 silk tie. This incision was closed with 4-0 subcuticular stitch. The antecubital incision was closed with a deep layer of 3-0 Vicryl and the skin closed with 4-0 Vicryl. Dermabond was applied. The patient tolerated the procedure well and was  transferred to the recovery room in stable condition. All needle and sponge counts were correct.  Waverly Ferrari, MD, FACS [facility]  DATE OF DICTATION:   08/16/2012

## 2012-08-16 NOTE — Anesthesia Postprocedure Evaluation (Signed)
  Anesthesia Post-op Note  Patient: Willie [name]  Procedure(s) Performed: Procedure(s): ARTERIOVENOUS (AV) FISTULA CREATION (Left) LIGATION OF COMPETING BRANCHES OF ARTERIOVENOUS FISTULA (Left)  Patient Location: PACU  Anesthesia Type:MAC  Level of Consciousness: awake, alert , oriented and patient cooperative  Airway and Oxygen Therapy: Patient Spontanous Breathing  Post-op Pain: mild  Post-op Assessment: Post-op Vital signs reviewed, Patient's Cardiovascular Status Stable, Respiratory Function Stable, Patent Airway, No signs of Nausea or vomiting and Pain level controlled  Post-op Vital Signs: stable  Complications: No apparent anesthesia complications

## 2012-08-17 ENCOUNTER — Encounter (HOSPITAL_COMMUNITY): Payer: Self-pay | Admitting: Vascular Surgery

## 2012-08-24 ENCOUNTER — Telehealth: Payer: Self-pay

## 2012-08-24 ENCOUNTER — Encounter: Payer: Self-pay | Admitting: Vascular Surgery

## 2012-08-24 ENCOUNTER — Ambulatory Visit (INDEPENDENT_AMBULATORY_CARE_PROVIDER_SITE_OTHER): Payer: BC Managed Care – PPO | Admitting: Vascular Surgery

## 2012-08-24 ENCOUNTER — Encounter (INDEPENDENT_AMBULATORY_CARE_PROVIDER_SITE_OTHER): Payer: BC Managed Care – PPO | Admitting: *Deleted

## 2012-08-24 VITALS — BP 116/58 | HR 59 | Resp 16 | Ht 73.5 in | Wt 180.0 lb

## 2012-08-24 DIAGNOSIS — M79609 Pain in unspecified limb: Secondary | ICD-10-CM | POA: Insufficient documentation

## 2012-08-24 DIAGNOSIS — R238 Other skin changes: Secondary | ICD-10-CM

## 2012-08-24 DIAGNOSIS — Z48812 Encounter for surgical aftercare following surgery on the circulatory system: Secondary | ICD-10-CM

## 2012-08-24 DIAGNOSIS — M7989 Other specified soft tissue disorders: Secondary | ICD-10-CM

## 2012-08-24 DIAGNOSIS — R230 Cyanosis: Secondary | ICD-10-CM

## 2012-08-24 DIAGNOSIS — R209 Unspecified disturbances of skin sensation: Secondary | ICD-10-CM | POA: Insufficient documentation

## 2012-08-24 NOTE — Telephone Encounter (Signed)
Wife called to report pt. Has blue discoloration of nailbeds left hand and cold sensation of left hand.  Denies pain in hand.  Reports having had some numbness/ tingling in left hand, but denies at present time.  Reports has been elevating his left arm as instructed.  Describes left arm gets tight and looks like it has red streaks, when it is hanging down.  Discussed w/ Dr. Imogene Burn.  Recommended to bring pt. In for Left Upper Extremity Arterial Duplex today.  Wife advised and will bring pt. To office.

## 2012-08-24 NOTE — Telephone Encounter (Signed)
Added appt to [facility]'s schedule for today/notified medical records for pulled chart/awt

## 2012-08-24 NOTE — Progress Notes (Signed)
[facility] [place]  Postoperative Access Visit  History of Present Illness  Willie [name] is a 62 y.o. year old male who presents for postoperative follow-up for: L BC AVF with side branch ligation (Date: 08/16/12).  The patient's wounds are healing.  The patient notes mild steal symptoms.  The patient is able to complete their activities of daily living.  The patient's current symptoms are: L arm swelling and intermittent numbness in L hand.  Physical Examination  Filed Vitals:   08/24/12 1639  BP: 116/58  Pulse: 59  Resp: 16   LUE: Incision is c/d/i, skin feels warm, hand grip is 5/5, sensation in digits is intact, palpable thrill, bruit can be auscultated , L upper arm is somewhat swollen  L arm arterial duplex (Date: 08/24/12)  No radial visualized  Ulnar patent with triphasic flow  Medical Decision Making  Willie [name] is a 62 y.o. year old male who presents s/p L BC AVF.  The patient's exam is c/w with recent fistula placement.  He has some steal sx but the left hand appears perfused and asx currently.  I'm not certain what to make of the missing radial artery on duplex, given the documented dopplerable radial signal intraoperatively.  Regardless, I don't see any justification for immediately exploring the fistula and radial artery.  The patient will follow with Dr. Edilia Bo at his scheduled follow up.  Willie Sake, MD [facility] [place] Office: [phone] Pager: [phone]

## 2012-09-14 ENCOUNTER — Other Ambulatory Visit: Payer: Self-pay | Admitting: *Deleted

## 2012-09-14 DIAGNOSIS — Z4931 Encounter for adequacy testing for hemodialysis: Secondary | ICD-10-CM

## 2012-09-14 DIAGNOSIS — N186 End stage renal disease: Secondary | ICD-10-CM

## 2012-09-25 ENCOUNTER — Encounter: Payer: Self-pay | Admitting: Vascular Surgery

## 2012-09-26 ENCOUNTER — Encounter (INDEPENDENT_AMBULATORY_CARE_PROVIDER_SITE_OTHER): Payer: BC Managed Care – PPO | Admitting: *Deleted

## 2012-09-26 ENCOUNTER — Encounter: Payer: Self-pay | Admitting: Vascular Surgery

## 2012-09-26 ENCOUNTER — Ambulatory Visit (INDEPENDENT_AMBULATORY_CARE_PROVIDER_SITE_OTHER): Payer: BC Managed Care – PPO | Admitting: Vascular Surgery

## 2012-09-26 VITALS — BP 135/79 | HR 60 | Resp 16 | Ht 73.0 in | Wt 177.0 lb

## 2012-09-26 DIAGNOSIS — N186 End stage renal disease: Secondary | ICD-10-CM

## 2012-09-26 DIAGNOSIS — R238 Other skin changes: Secondary | ICD-10-CM

## 2012-09-26 DIAGNOSIS — R202 Paresthesia of skin: Secondary | ICD-10-CM | POA: Insufficient documentation

## 2012-09-26 DIAGNOSIS — R209 Unspecified disturbances of skin sensation: Secondary | ICD-10-CM

## 2012-09-26 DIAGNOSIS — R200 Anesthesia of skin: Secondary | ICD-10-CM | POA: Insufficient documentation

## 2012-09-26 DIAGNOSIS — T82598A Other mechanical complication of other cardiac and vascular devices and implants, initial encounter: Secondary | ICD-10-CM

## 2012-09-26 DIAGNOSIS — M7989 Other specified soft tissue disorders: Secondary | ICD-10-CM

## 2012-09-26 DIAGNOSIS — Z4931 Encounter for adequacy testing for hemodialysis: Secondary | ICD-10-CM

## 2012-09-26 NOTE — Progress Notes (Signed)
[facility] of [place]  HPI: Willie [name] is a 62 y.o. year old male who presents for postoperative follow-up for: L BC AVF with side branch ligation (Date: 08/16/12). The patient's wounds are healing. The patient notes mild steal symptoms. The patient is able to complete their activities of daily living. The patient's symptoms of: L arm swelling and intermittent numbness in L hand are showing some improvements since his last visit.  Objective 135/79 60   16 100%  Left upper extremity incisions healing well Palpable pulse and palpable thrill in AV fistula Numbness in the ulnar nerve distribution.  Size is 0.52 to 0.69 with areas of valvular narrowing and anastomosis.  Assessment/Planning: S/P left brachial cephalic AV fistula   Ligation of one competing branch  He will have him follow up in 6 weeks to perform a follow up duplex and check maturation of the fistula.  We recommend exercise and elevation to help with the post op swelling and growth of the fistula.  Clinton Gallant [facility] 09/26/2012 4:42 PM  Agree with above. I will see him back in 6 weeks for follow duplex scan. At that time we will consider a fistulogram with limited contrast if his fistula has not continued to mature adequately.  Waverly Ferrari, MD, FACS Beeper [phone] 09/26/2012

## 2012-10-09 ENCOUNTER — Other Ambulatory Visit: Payer: Self-pay | Admitting: *Deleted

## 2012-10-09 DIAGNOSIS — N186 End stage renal disease: Secondary | ICD-10-CM

## 2012-10-09 DIAGNOSIS — Z4931 Encounter for adequacy testing for hemodialysis: Secondary | ICD-10-CM

## 2012-11-06 ENCOUNTER — Encounter: Payer: Self-pay | Admitting: Vascular Surgery

## 2012-11-07 ENCOUNTER — Ambulatory Visit: Payer: BC Managed Care – PPO | Admitting: Vascular Surgery

## 2012-12-18 ENCOUNTER — Telehealth: Payer: Self-pay | Admitting: *Deleted

## 2012-12-18 NOTE — Telephone Encounter (Signed)
Willie [name] wanted to see if Willie [name] could see Dr Edilia Bo because his left arm and hand continue to be swollen. His hand is now puffy with his palm and nailbeds cyanotic. His hand is ice cold and dark. She said he has been elevating the arm but the swelling doesn't even go down then. She said she was sorry they missed the appt in June but it was becoming hard to keep up with all his appointments. She said they were going to try PD but had not heard from Dr Deterding's office about his labs & what was next. 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 I suggested she call Dr Deterding's office. I told her I would talk to Dr Edilia Bo about trying to work him in because his schedule is full until Sept. and call her back

## 2012-12-20 ENCOUNTER — Telehealth: Payer: Self-pay | Admitting: Vascular Surgery

## 2012-12-20 NOTE — Telephone Encounter (Addendum)
Message copied by Fredrich Birks on Thu Dec 20, 2012 11:58 AM ------      Message from: Melene Plan      Created: Thu Dec 20, 2012  9:49 AM      Regarding: FW: office visit                   ----- Message -----         From: Chuck Hint, MD         Sent: 12/20/2012   9:23 AM           To: Melene Plan, RN      Subject: RE: office visit                                         No lab needed. Thanks            ----- Message -----         From: Melene Plan, RN         Sent: 12/18/2012   1:28 PM           To: Chuck Hint, MD      Subject: RE: office visit                                         With a lab?      ----- Message -----         From: Chuck Hint, MD         Sent: 12/18/2012  12:46 PM           To: Melene Plan, RN      Subject: office visit                                             We can try to squeeze him in the next week if possible.      Chris              ------  12/20/2012: left message for patient regarding follow up on 12/26/12, dpm

## 2012-12-25 ENCOUNTER — Encounter: Payer: Self-pay | Admitting: Vascular Surgery

## 2012-12-26 ENCOUNTER — Ambulatory Visit: Payer: BC Managed Care – PPO | Admitting: Vascular Surgery

## 2013-02-05 ENCOUNTER — Encounter: Payer: Self-pay | Admitting: Vascular Surgery

## 2013-02-06 ENCOUNTER — Ambulatory Visit (INDEPENDENT_AMBULATORY_CARE_PROVIDER_SITE_OTHER): Payer: BC Managed Care – PPO | Admitting: Vascular Surgery

## 2013-02-06 ENCOUNTER — Encounter: Payer: Self-pay | Admitting: Vascular Surgery

## 2013-02-06 VITALS — BP 145/83 | HR 64 | Ht 73.0 in | Wt 173.0 lb

## 2013-02-06 DIAGNOSIS — N186 End stage renal disease: Secondary | ICD-10-CM

## 2013-02-06 NOTE — Progress Notes (Signed)
Patient name: Willie [name] MRN: [id number] DOB: 19-Sep-1950 Sex: male  REASON FOR VISIT: follow up after left brachiocephalic AV fistula  HPI: Willie [name] is a 62 y.o. male who is not yet on dialysis. He had a left brachiocephalic AV fistula and ligation of 1 competing branch on 08/16/2012. He had a diminished radial pulse preoperatively. He comes in for a follow up visit. He tells me that he intermittently has some paresthesias in the left hand but this is not constant. He has no significant pain. He denies any recent uremic symptoms.  REVIEW OF SYSTEMS: Arly.Keller ] denotes positive finding; [  ] denotes negative finding  CARDIOVASCULAR:  [ ]  chest pain   [ ]  dyspnea on exertion    CONSTITUTIONAL:  [ ]  fever   [ ]  chills  PHYSICAL EXAM: Filed Vitals:   02/06/13 1428  BP: 145/83  Pulse: 64  Height: 6\' 1"  (1.854 m)  Weight: 173 lb (78.472 kg)  SpO2: 99%   Body mass index is 22.83 kg/(m^2). GENERAL: The patient is a well-nourished male, in no acute distress. The vital signs are documented above. CARDIOVASCULAR: There is a regular rate and rhythm  PULMONARY: There is good air exchange bilaterally without wheezing or rales. His fistula is somewhat pulsatile proximally with a decent thrill distally. In the central portion of the fistula the vein appears somewhat small. He has a radial and ulnar signal with the Doppler although I cannot palpate a radial pulse on the left.  I have independently interpreted his duplex today which shows that he has a patent fistulas with diameters ranging from 0.2 6.57 cm. There are increased velocities in the proximal part of the fistula.   MEDICAL ISSUES: This patient has some mild narrowing in the proximal fistula and also some mild steal. We discussed proceeding with a fistulogram and potentially performing venoplasty of the stenosis in the proximal fistula although this could potentially make his steal symptoms worse. After long discussion with the patient we  elected to repeat his duplex scan in 6 weeks. If the diameters of the vein are improving then we will not proceed with a fistulogram. Otherwise we'll need to proceed with a fistulogram to further assess the fistula. I've explained that his symptoms in his left hand from his mild steal worsen that he should let us know sooner. Of note, preoperatively he had a normal right radial pulse with a slightly diminished left radial pulse. The steal symptoms progress on the left he would have to be considered for ligation of the fistula and placement of access in the right arm.  DICKSON,CHRISTOPHER S [facility] [place] [facility]: [phone]

## 2013-02-22 ENCOUNTER — Other Ambulatory Visit: Payer: Self-pay | Admitting: *Deleted

## 2013-02-22 DIAGNOSIS — N186 End stage renal disease: Secondary | ICD-10-CM

## 2013-02-22 DIAGNOSIS — Z4931 Encounter for adequacy testing for hemodialysis: Secondary | ICD-10-CM

## 2013-03-19 ENCOUNTER — Encounter: Payer: Self-pay | Admitting: Vascular Surgery

## 2013-03-20 ENCOUNTER — Encounter: Payer: Self-pay | Admitting: Vascular Surgery

## 2013-03-20 ENCOUNTER — Ambulatory Visit (HOSPITAL_COMMUNITY)
Admission: RE | Admit: 2013-03-20 | Discharge: 2013-03-20 | Disposition: A | Discharge status: Home / Self Care | Payer: BC Managed Care – PPO | Source: Ambulatory Visit | Attending: Vascular Surgery | Admitting: Vascular Surgery

## 2013-03-20 ENCOUNTER — Ambulatory Visit (INDEPENDENT_AMBULATORY_CARE_PROVIDER_SITE_OTHER): Payer: BC Managed Care – PPO | Admitting: Vascular Surgery

## 2013-03-20 VITALS — BP 122/65 | HR 62 | Ht 73.0 in | Wt 169.0 lb

## 2013-03-20 DIAGNOSIS — N184 Chronic kidney disease, stage 4 (severe): Secondary | ICD-10-CM

## 2013-03-20 DIAGNOSIS — Z4931 Encounter for adequacy testing for hemodialysis: Secondary | ICD-10-CM | POA: Insufficient documentation

## 2013-03-20 DIAGNOSIS — N186 End stage renal disease: Secondary | ICD-10-CM | POA: Insufficient documentation

## 2013-03-20 DIAGNOSIS — I82A12 Acute embolism and thrombosis of left axillary vein: Secondary | ICD-10-CM

## 2013-03-20 DIAGNOSIS — I82A19 Acute embolism and thrombosis of unspecified axillary vein: Secondary | ICD-10-CM

## 2013-03-20 NOTE — Assessment & Plan Note (Signed)
His fistula has a reasonable diameters and would likely be adequate for access. He does have an area of stenosis in the proximal fistula. He has had some steal symptoms in the left upper extremity. I am reluctant to consider venoplasty of the area of stenosis as this could potentially make his steal symptoms worse. He was also noted on this study today to have a DVT in one of the brachial veins in the upper arm. I have recommended a follow duplex scan in 1 week to be sure that was no propagation of his clot. I think it would be unlikely for this to propagate into the subclavian system given that he has a patent fistula. I've instructed him to elevate his arm. See him back in one week. He knows to call sooner if he has problems.

## 2013-03-20 NOTE — Progress Notes (Signed)
[facility] [place]  Patient name: Willie [name] MRN: [id number] DOB: 28-Nov-1950 Sex: male  REASON FOR VISIT: follow up of left brachiocephalic AV fistula.  HPI: Willie [name] is a 62 y.o. male who was last seen on 02/06/2013. He had a left brachiocephalic AV fistula placed in March of this year. When I saw him in September he complained of some paresthesias in his left hand intermittently. Duplex scan at that time showed a patent fistula with some increased velocities in the proximal aspect of the fistula. He had evidence of mild steal. My concern was that if we proceeded with a fistulogram and performed venoplasty of the stenosis in the proximal fistula this would only make his steal symptoms worse. Therefore we elected to bring him back in 6 weeks for follow up duplex.  Since I saw him last, he states that he is really not had any significant pain in the left hand. He has had some persistent swelling. He is not yet on dialysis.   Past Medical History  Diagnosis Date  . Hyperlipidemia   . Myocardial infarction   . Foot pain     while lying flat  . Peripheral vascular disease   . Anemia   . Obesity   . Hypertension     Dr. Carole Binning- seen early (last) - 07/2012  . H/O cardiovascular stress test 2014    thru [facility] in [place] /w Dr. Carole Binning  . Stroke     care for stroke- [facility]  . Diabetes mellitus     h/o , resolved since gastric bypass   . Chronic kidney disease     Dr. Darrick Penna- pt. remarks its stage 4   . GERD (gastroesophageal reflux disease)     uses pepcid PRN  . Arthritis     low back pain   . CAD (coronary artery disease)     pt. rec. care fr. Dr. Cheri Kearns & Dr. Carole Binning, plan for defib placement on 09/19/2012     Family History  Problem Relation Age of Onset  . Hypertension Mother   . Diabetes Father     Amputation  . Other Father     amputation   SOCIAL HISTORY: History  Substance Use Topics  . Smoking status: Never Smoker   .  Smokeless tobacco: Never Used  . Alcohol Use: No   No Known Allergies Current Outpatient Prescriptions  Medication Sig Dispense Refill  . aspirin EC 81 MG tablet Take 81 mg by mouth daily before breakfast.       . calcitRIOL (ROCALTROL) 0.5 MCG capsule Take 0.5 mcg by mouth every other day.       . Calcium Carbonate Antacid (TUMS PO) Take by mouth.      . famotidine (PEPCID) 20 MG tablet Take 20 mg by mouth 2 (two) times daily.      . furosemide (LASIX) 40 MG tablet Take 80 mg by mouth daily before breakfast.       . isosorbide mononitrate (IMDUR) 30 MG 24 hr tablet Take 30 mg by mouth daily before breakfast.       . lisinopril (PRINIVIL,ZESTRIL) 20 MG tablet Take 20 mg by mouth at bedtime.       . metoprolol (LOPRESSOR) 50 MG tablet Take 50 mg by mouth 2 (two) times daily.       Marland Kitchen oxyCODONE-acetaminophen (ROXICET) 5-325 MG per tablet Take 1-2 tablets by mouth every 4 (four) hours as needed for pain.  20 tablet  0  .  sodium bicarbonate 650 MG tablet Take 1,300 mg by mouth 2 (two) times daily.       . traZODone (DESYREL) 100 MG tablet Take 150 mg by mouth at bedtime.        No current facility-administered medications for this visit.   REVIEW OF SYSTEMS: Arly.Keller ] denotes positive finding; [  ] denotes negative finding  CARDIOVASCULAR:  [ ]  chest pain   [ ]  chest pressure   [ ]  palpitations   [ ]  orthopnea   [ ]  dyspnea on exertion   [ ]  claudication   [ ]  rest pain   [ ]  DVT   [ ]  phlebitis PULMONARY:   [ ]  productive cough   [ ]  asthma   [ ]  wheezing NEUROLOGIC:   [ ]  weakness  [ ]  paresthesias  [ ]  aphasia  [ ]  amaurosis  [ ]  dizziness HEMATOLOGIC:   [ ]  bleeding problems   [ ]  clotting disorders MUSCULOSKELETAL:  [ ]  joint pain   [ ]  joint swelling [ ]  leg swelling GASTROINTESTINAL: [ ]   blood in stool  [ ]   hematemesis GENITOURINARY:  [ ]   dysuria  [ ]   hematuria PSYCHIATRIC:  [ ]  history of major depression INTEGUMENTARY:  [ ]  rashes  [ ]  ulcers CONSTITUTIONAL:  [ ]  fever   [ ]   chills  PHYSICAL EXAM: Filed Vitals:   03/20/13 1438  BP: 122/65  Pulse: 62  Height: 6\' 1"  (1.854 m)  Weight: 169 lb (76.658 kg)  SpO2: 92%   Body mass index is 22.3 kg/(m^2). GENERAL: The patient is a well-nourished male, in no acute distress. The vital signs are documented above. CARDIOVASCULAR: There is a regular rate and rhythm. I cannot palpate a radial pulse although he has a fairly brisk radial ulnar and palmar arch signal with the Doppler on the left. His Doppler signals on the left hand do augment with compression of his fistula. He has moderate left upper extremity swelling. PULMONARY: There is good air exchange bilaterally without wheezing or rales. Motor function is intact.  DATA:  I have independently interpreted his duplex scan today. Of note he is noted to have a DVT involving one of the brachial veins in the upper arm. A left brachiocephalic fistula is patent with diameters ranging from 0.50-0.74. Depths appear very reasonable.   MEDICAL ISSUES:  Chronic kidney disease (CKD), stage IV (severe) His fistula has a reasonable diameters and would likely be adequate for access. He does have an area of stenosis in the proximal fistula. He has had some steal symptoms in the left upper extremity. I am reluctant to consider venoplasty of the area of stenosis as this could potentially make his steal symptoms worse. He was also noted on this study today to have a DVT in one of the brachial veins in the upper arm. I have recommended a follow duplex scan in 1 week to be sure that was no propagation of his clot. I think it would be unlikely for this to propagate into the subclavian system given that he has a patent fistula. I've instructed him to elevate his arm. See him back in one week. He knows to call sooner if he has problems.   Kacper Cartlidge S [facility] [place] [facility]: [phone]

## 2013-03-26 ENCOUNTER — Encounter: Payer: Self-pay | Admitting: Vascular Surgery

## 2013-03-27 ENCOUNTER — Ambulatory Visit (INDEPENDENT_AMBULATORY_CARE_PROVIDER_SITE_OTHER): Payer: BC Managed Care – PPO | Admitting: Vascular Surgery

## 2013-03-27 ENCOUNTER — Ambulatory Visit (HOSPITAL_COMMUNITY)
Admission: RE | Admit: 2013-03-27 | Discharge: 2013-03-27 | Disposition: A | Discharge status: Home / Self Care | Payer: BC Managed Care – PPO | Source: Ambulatory Visit | Attending: Vascular Surgery | Admitting: Vascular Surgery

## 2013-03-27 ENCOUNTER — Encounter: Payer: Self-pay | Admitting: Vascular Surgery

## 2013-03-27 VITALS — BP 128/75 | HR 75 | Ht 73.0 in | Wt 165.7 lb

## 2013-03-27 DIAGNOSIS — I82409 Acute embolism and thrombosis of unspecified deep veins of unspecified lower extremity: Secondary | ICD-10-CM | POA: Insufficient documentation

## 2013-03-27 DIAGNOSIS — I82A12 Acute embolism and thrombosis of left axillary vein: Secondary | ICD-10-CM

## 2013-03-27 DIAGNOSIS — N186 End stage renal disease: Secondary | ICD-10-CM

## 2013-03-27 DIAGNOSIS — I82A19 Acute embolism and thrombosis of unspecified axillary vein: Secondary | ICD-10-CM | POA: Insufficient documentation

## 2013-03-27 DIAGNOSIS — I82402 Acute embolism and thrombosis of unspecified deep veins of left lower extremity: Secondary | ICD-10-CM

## 2013-03-27 MED ORDER — ENOXAPARIN SODIUM 150 MG/ML ~~LOC~~ SOLN: 1.0000 mg/kg | SUBCUTANEOUS | Status: DC

## 2013-03-27 NOTE — Assessment & Plan Note (Signed)
This patient has a DVT of the left upper remedy extending from the brachial vein mapping to the axillary vein and proximal subclavian vein. Given the propagation of the clot, I have written him a prescription for Lovenox and will have him follow up with Dr. Danella Deis in [place] to start him on Coumadin and manage his Coumadin. I'll plan on seeing him back in 3 months with a follow duplex scan. I think he will need Coumadin for 3 months unless there is significant clot remaining at that point which point we may extended the Coumadin for 6 months.

## 2013-03-27 NOTE — Progress Notes (Signed)
Patient name: Willie [name] MRN: [id number] DOB: 12-04-1950 Sex: male  REASON FOR VISIT: follow up of left brachiocephalic AV fistula  HPI: Willie [name] is a 62 y.o. male who had a left brachiocephalic AV fistula placed in March of this year. I saw a week ago he came in for a 6 week follow up visit as he develops and steal symptoms on the left. At that time he had undergone a duplex scan which showed a DVT involving one of the brachial veins in the upper arm. He was set up for a follow duplex scan to be sure there was no propagation of his clot. He is not yet on dialysis. His renal function has been stable.  REVIEW OF SYSTEMS: Arly.Keller ] denotes positive finding; [  ] denotes negative finding  CARDIOVASCULAR:  [ ]  chest pain   [ ]  dyspnea on exertion    CONSTITUTIONAL:  [ ]  fever   [ ]  chills  PHYSICAL EXAM: Filed Vitals:   03/27/13 1242  BP: 128/75  Pulse: 75  Height: 6\' 1"  (1.854 m)  Weight: 165 lb 11.2 oz (75.161 kg)  SpO2: 100%   Body mass index is 21.87 kg/(m^2). GENERAL: The patient is a well-nourished male, in no acute distress. The vital signs are documented above. CARDIOVASCULAR: There is a regular rate and rhythm  PULMONARY: There is good air exchange bilaterally without wheezing or rales. His left upper arm fistula has an excellent thrill. It appears to be maturing adequately. He has some persistent swelling in the left upper arm.  Duplex scan that showed that the DVT in his left brachial vein has propagated into the axillary vein and distal subclavian vein.  MEDICAL ISSUES:  DVT (deep venous thrombosis) This patient has a DVT of the left upper remedy extending from the brachial vein mapping to the axillary vein and proximal subclavian vein. Given the propagation of the clot, I have written him a prescription for Lovenox and will have him follow up with Dr. Danella Deis in [place] to start him on Coumadin and manage his Coumadin. I'll plan on seeing him back in 3 months with a follow  duplex scan. I think he will need Coumadin for 3 months unless there is significant clot remaining at that point which point we may extended the Coumadin for 6 months.   Nezzie Manera S [facility] [place] [facility]: [phone]

## 2013-03-28 ENCOUNTER — Other Ambulatory Visit: Payer: Self-pay

## 2013-05-29 ENCOUNTER — Telehealth: Payer: Self-pay

## 2013-05-29 ENCOUNTER — Other Ambulatory Visit: Payer: Self-pay

## 2013-05-29 DIAGNOSIS — T82898A Other specified complication of vascular prosthetic devices, implants and grafts, initial encounter: Secondary | ICD-10-CM

## 2013-05-29 NOTE — Telephone Encounter (Signed)
Phone call from pt. with c/o swelling in left arm.  Reports has had swelling since the AVF was placed.  Reports he elevates the arm at night, and notices some improvement in the AM, but that the swelling increases throughout the day.  States his arm gets painful at times and feels heavy.  Reports is able to grip without any difficulty.  C/o left hand and fingers being cold intermittently, and finger tips get a bluish discoloration at times.  States his left hand "feels asleep" with prolonged elevation.  Will discuss w/ Dr. Edilia Boickson.

## 2013-05-29 NOTE — Telephone Encounter (Signed)
Discussed pt. symptoms with Dr. Edilia Boickson; recommended to schedule U/S study to r/o steal syndrome and see a provider next week.  Left voicemessage that pt. will be contacted with an appt. for next week.

## 2013-06-03 ENCOUNTER — Encounter: Payer: Self-pay | Admitting: Vascular Surgery

## 2013-06-04 ENCOUNTER — Ambulatory Visit (INDEPENDENT_AMBULATORY_CARE_PROVIDER_SITE_OTHER): Payer: BC Managed Care – PPO | Admitting: Vascular Surgery

## 2013-06-04 ENCOUNTER — Encounter: Payer: Self-pay | Admitting: Vascular Surgery

## 2013-06-04 ENCOUNTER — Ambulatory Visit (HOSPITAL_COMMUNITY)
Admission: RE | Admit: 2013-06-04 | Discharge: 2013-06-04 | Disposition: A | Discharge status: Home / Self Care | Payer: BC Managed Care – PPO | Source: Ambulatory Visit | Attending: Vascular Surgery | Admitting: Vascular Surgery

## 2013-06-04 VITALS — BP 124/79 | HR 74 | Resp 18 | Ht 73.0 in | Wt 180.0 lb

## 2013-06-04 DIAGNOSIS — T82898A Other specified complication of vascular prosthetic devices, implants and grafts, initial encounter: Secondary | ICD-10-CM

## 2013-06-04 DIAGNOSIS — Z9889 Other specified postprocedural states: Secondary | ICD-10-CM | POA: Insufficient documentation

## 2013-06-04 DIAGNOSIS — M7989 Other specified soft tissue disorders: Secondary | ICD-10-CM | POA: Insufficient documentation

## 2013-06-04 DIAGNOSIS — N186 End stage renal disease: Secondary | ICD-10-CM

## 2013-06-04 DIAGNOSIS — R238 Other skin changes: Secondary | ICD-10-CM

## 2013-06-04 DIAGNOSIS — R209 Unspecified disturbances of skin sensation: Secondary | ICD-10-CM

## 2013-06-04 NOTE — Progress Notes (Signed)
Subjective:     Patient ID: Willie [name], male   DOB: [birth year], 63 y.o.   MRN: [id number]  HPI this 63 -year-old male with chronic renal insufficiency but not yet on hemodialysis returns for further followup regrowing his swollen left upper [place]. He had left brachial cephalic fistula created by Dr. Durwin Noraixon in the past and developed some thrombus in the brachial vein which propagated into the axillary vein and he is currently on Coumadin since October. He is scheduled to see Dr. Durwin Noraixon in about 3-4 weeks. Patient continues to have swelling in the left arm although the pain or swelling and neither worsened much since he saw Dr. Durwin Noraixon in the past. He is in the office late today with no time to perform an ultrasound in our office.  Past Medical History  Diagnosis Date  . Hyperlipidemia   . Myocardial infarction   . Foot pain     while lying flat  . Peripheral vascular disease   . Anemia   . Obesity   . Hypertension     Dr. Carole BinningMeadows- seen early (last) - 07/2012  . H/O cardiovascular stress test 2014    [facility] [place]w Dr. Carole BinningMeadows  . Stroke     [facility]  . Diabetes mellitus     h/o , resolved since gastric bypass   . Chronic kidney disease     Dr. Darrick Pennaeterding- pt. remarks its stage 4   . GERD (gastroesophageal reflux disease)     uses pepcid PRN  . Arthritis     low back pain   . CAD (coronary artery disease)     pt. rec. care fr. Dr. Cheri KearnsShu & Dr. Carole BinningMeadows, plan for defib placement on 09/19/2012      History  Substance Use Topics  . Smoking status: Never Smoker   . Smokeless tobacco: Never Used  . Alcohol Use: No    Family History  Problem Relation Age of Onset  . Hypertension Mother   . Diabetes Father     Amputation  . Other Father     amputation    No Known Allergies  Current outpatient prescriptions:aspirin EC 81 MG tablet, Take 81 mg by mouth daily before breakfast. , Disp: , Rfl: ;  b complex-vitamin c-folic acid (NEPHRO-VITE) 0.8 MG  TABS tablet, Take 1 tablet by mouth at bedtime., Disp: , Rfl: ;  calcitRIOL (ROCALTROL) 0.5 MCG capsule, Take 0.5 mcg by mouth every other day. , Disp: , Rfl: ;  Calcium Carbonate Antacid (TUMS PO), Take by mouth., Disp: , Rfl:  famotidine (PEPCID) 20 MG tablet, Take 20 mg by mouth 2 (two) times daily., Disp: , Rfl: ;  furosemide (LASIX) 40 MG tablet, Take 80 mg by mouth daily before breakfast. , Disp: , Rfl: ;  isosorbide mononitrate (IMDUR) 30 MG 24 hr tablet, Take 30 mg by mouth daily before breakfast. , Disp: , Rfl: ;  lisinopril (PRINIVIL,ZESTRIL) 20 MG tablet, Take 20 mg by mouth at bedtime. , Disp: , Rfl:  metoprolol (LOPRESSOR) 50 MG tablet, Take 50 mg by mouth 2 (two) times daily. , Disp: , Rfl: ;  sevelamer carbonate (RENVELA) 800 MG tablet, Take 800 mg by mouth 3 (three) times daily with meals., Disp: , Rfl: ;  sodium bicarbonate 650 MG tablet, Take 1,300 mg by mouth 2 (two) times daily. , Disp: , Rfl: ;  traZODone (DESYREL) 100 MG tablet, Take 150 mg by mouth at bedtime. , Disp: , Rfl:  warfarin (  COUMADIN) 5 MG tablet, Take 5 mg by mouth daily. On Monday,Wednesday and Friday take 1 1/2 tablet   5mg  tablet.  On Tuesday, Thursday ,Saturday and Sunday take 1   5mg  tablet, Disp: , Rfl: ;  enoxaparin (LOVENOX) 150 MG/ML injection, Inject 0.5 mLs (75 mg total) into the skin daily., Disp: 4 Syringe, Rfl: 1 oxyCODONE-acetaminophen (ROXICET) 5-325 MG per tablet, Take 1-2 tablets by mouth every 4 (four) hours as needed for pain., Disp: 20 tablet, Rfl: 0  BP 124/79  Pulse 74  Resp 18  Ht 6\' 1"  (1.854 m)  Wt 180 lb (81.647 kg)  BMI 23.75 kg/m2  Body mass index is 23.75 kg/(m^2).           Review of Systems     Objective:   Physical Exam BP 124/79  Pulse 74  Resp 18  Ht 6\' 1"  (1.854 m)  Wt 180 lb (81.647 kg)  BMI 23.75 kg/m2  General well-developed well-nourished male in no apparent stress alert and oriented x3 Lungs no rhonchi or wheezing Left arm with 1-2+ edema from fingertips  to axilla. 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 Durwin Nora as scheduled in 3-4 weeks with duplex scan of left axillary vein. He will continue on Coumadin therapy. Decision will need to be made regarding ligation of the fistula versus continued observation on anticoagulation. I discussed this with the patient and he understands that

## 2013-06-25 ENCOUNTER — Encounter: Payer: Self-pay | Admitting: Vascular Surgery

## 2013-06-26 ENCOUNTER — Ambulatory Visit (INDEPENDENT_AMBULATORY_CARE_PROVIDER_SITE_OTHER): Payer: BC Managed Care – PPO | Admitting: Vascular Surgery

## 2013-06-26 ENCOUNTER — Other Ambulatory Visit: Payer: Self-pay | Admitting: *Deleted

## 2013-06-26 ENCOUNTER — Ambulatory Visit (HOSPITAL_COMMUNITY)
Admission: RE | Admit: 2013-06-26 | Discharge: 2013-06-26 | Disposition: A | Discharge status: Home / Self Care | Payer: BC Managed Care – PPO | Source: Ambulatory Visit | Attending: Vascular Surgery | Admitting: Vascular Surgery

## 2013-06-26 ENCOUNTER — Encounter: Payer: Self-pay | Admitting: Vascular Surgery

## 2013-06-26 VITALS — BP 141/78 | HR 61 | Resp 18 | Ht 73.0 in | Wt 181.0 lb

## 2013-06-26 DIAGNOSIS — R238 Other skin changes: Secondary | ICD-10-CM

## 2013-06-26 DIAGNOSIS — N186 End stage renal disease: Secondary | ICD-10-CM

## 2013-06-26 DIAGNOSIS — I82629 Acute embolism and thrombosis of deep veins of unspecified upper extremity: Secondary | ICD-10-CM | POA: Insufficient documentation

## 2013-06-26 DIAGNOSIS — R209 Unspecified disturbances of skin sensation: Secondary | ICD-10-CM

## 2013-06-26 DIAGNOSIS — I82402 Acute embolism and thrombosis of unspecified deep veins of left lower extremity: Secondary | ICD-10-CM

## 2013-06-26 DIAGNOSIS — I82409 Acute embolism and thrombosis of unspecified deep veins of unspecified lower extremity: Secondary | ICD-10-CM

## 2013-06-26 NOTE — Progress Notes (Signed)
[facility] [place]  Patient name: Willie [name] MRN: [id number] DOB: 1951-01-12 Sex: male  REASON FOR VISIT: Follow up of left arm swelling  HPI: Willie ClimesClary L Mcilrath is a 63 y.o. male Who I last saw on 03/27/2013. He had a left brachiocephalic AV fistula placed in March of 2014. Essentially developed a DVT the left upper cherry extending from the brachial vein to the axillary vein. He was started on Coumadin. He was seen by Dr. Hart RochesterLawson on 06/04/2013. He was continued on Coumadin at that time. Continue to have problems with left arm swelling. He comes in for a follow up visit. He continues to have significant left upper tree swelling which has not improved. He is not yet on dialysis. He also complains of the left hand is cool at times.  Past Medical History  Diagnosis Date  . Hyperlipidemia   . Myocardial infarction   . Foot pain     while lying flat  . Peripheral vascular disease   . Anemia   . Obesity   . Hypertension     Dr. Carole BinningMeadows- seen early (last) - 07/2012  . H/O cardiovascular stress test 2014    [facility] [place]w Dr. Carole BinningMeadows  . Stroke     [facility]  . Diabetes mellitus     h/o , resolved since gastric bypass   . Chronic kidney disease     Dr. Darrick Pennaeterding- pt. remarks its stage 4   . GERD (gastroesophageal reflux disease)     uses pepcid PRN  . Arthritis     low back pain   . CAD (coronary artery disease)     pt. rec. care fr. Dr. Cheri KearnsShu & Dr. Carole BinningMeadows, plan for defib placement on 09/19/2012     Family History  Problem Relation Age of Onset  . Hypertension Mother   . Diabetes Father     Amputation  . Other Father     amputation   SOCIAL HISTORY: History  Substance Use Topics  . Smoking status: Never Smoker   . Smokeless tobacco: Never Used  . Alcohol Use: No   No Known Allergies Current Outpatient Prescriptions  Medication Sig Dispense Refill  . aspirin EC 81 MG tablet Take 81 mg by mouth daily before  breakfast.       . calcitRIOL (ROCALTROL) 0.5 MCG capsule Take 0.5 mcg by mouth every other day.       . Calcium Carbonate Antacid (TUMS PO) Take by mouth.      . enoxaparin (LOVENOX) 150 MG/ML injection Inject 0.5 mLs (75 mg total) into the skin daily.  4 Syringe  1  . famotidine (PEPCID) 20 MG tablet Take 20 mg by mouth 2 (two) times daily.      . furosemide (LASIX) 40 MG tablet Take 80 mg by mouth daily before breakfast.       . isosorbide mononitrate (IMDUR) 30 MG 24 hr tablet Take 30 mg by mouth daily before breakfast.       . lisinopril (PRINIVIL,ZESTRIL) 20 MG tablet Take 20 mg by mouth at bedtime.       . metoprolol (LOPRESSOR) 50 MG tablet Take 50 mg by mouth 2 (two) times daily.       Marland Kitchen. oxyCODONE-acetaminophen (ROXICET) 5-325 MG per tablet Take 1-2 tablets by mouth every 4 (four) hours as needed for pain.  20 tablet  0  . sevelamer carbonate (RENVELA) 800 MG tablet Take 800 mg by mouth  3 (three) times daily with meals.      . sodium bicarbonate 650 MG tablet Take 1,300 mg by mouth 2 (two) times daily.       . traZODone (DESYREL) 100 MG tablet Take 150 mg by mouth at bedtime.       Marland Kitchen. warfarin (COUMADIN) 5 MG tablet Take 5 mg by mouth daily. On Monday,Wednesday and Friday take 1 1/2 tablet   5mg  tablet.  On Tuesday, Thursday ,Saturday and Sunday take 1   5mg  tablet      . b complex-vitamin c-folic acid (NEPHRO-VITE) 0.8 MG TABS tablet Take 1 tablet by mouth at bedtime.       No current facility-administered medications for this visit.   REVIEW OF SYSTEMS: Arly.Keller[X ] denotes positive finding; [  ] denotes negative finding  CARDIOVASCULAR:  [ ]  chest pain   [ ]  chest pressure   [ ]  palpitations   [ ]  orthopnea   [ ]  dyspnea on exertion   [ ]  claudication   [ ]  rest pain   [ ]  DVT   [ ]  phlebitis PULMONARY:   [ ]  productive cough   [ ]  asthma   [ ]  wheezing NEUROLOGIC:   Arly.Keller[X ] weakness  [ ]  paresthesias  [ ]  aphasia  [ ]  amaurosis  [ ]  dizziness HEMATOLOGIC:   [ ]  bleeding problems   [ ]   clotting disorders MUSCULOSKELETAL:  [ ]  joint pain   [ ]  joint swelling [ ]  leg swelling GASTROINTESTINAL: [ ]   blood in stool  [ ]   hematemesis GENITOURINARY:  [ ]   dysuria  [ ]   hematuria PSYCHIATRIC:  [ ]  history of major depression INTEGUMENTARY:  [ ]  rashes  [ ]  ulcers CONSTITUTIONAL:  [ ]  fever   [ ]  chills  PHYSICAL EXAM: Filed Vitals:   06/26/13 1208  BP: 141/78  Pulse: 61  Resp: 18  Height: 6\' 1"  (1.854 m)  Weight: 181 lb (82.101 kg)   Body mass index is 23.89 kg/(m^2). GENERAL: The patient is a well-nourished male, in no acute distress. The vital signs are documented above. CARDIOVASCULAR: There is a regular rate and rhythm.  PULMONARY: There is good air exchange bilaterally without wheezing or rales. He has significant left upper extremity swelling all went to the shoulder. The left hand is slightly cool.   DATA:  I have independently interpreted his venous duplex scan of the left upper extremity. 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  MEDICAL ISSUES: End stage renal disease Given the worsening swelling his left upper tree with chronic occlusion in addition to mild steal symptoms, I recommended that we ligate his left upper arm AV fistula. We will stop his Coumadin 3 days prior to the procedure in his surgery has been scheduled for 07/04/2013. Once we know that his left arm has improved we can consider new access in the right arm after preop evaluation.    Falynn Ailey S [facility] [place] [facility]: [phone]

## 2013-06-26 NOTE — Assessment & Plan Note (Signed)
Given the worsening swelling his left upper tree with chronic occlusion in addition to mild steal symptoms, I recommended that we ligate his left upper arm AV fistula. We will stop his Coumadin 3 days prior to the procedure in his surgery has been scheduled for 07/04/2013. Once we know that his left arm has improved we can consider new access in the right arm after preop evaluation.

## 2013-07-02 ENCOUNTER — Encounter (HOSPITAL_COMMUNITY): Payer: Self-pay | Admitting: Pharmacy Technician

## 2013-07-03 ENCOUNTER — Encounter (HOSPITAL_COMMUNITY): Payer: Self-pay | Admitting: *Deleted

## 2013-07-03 MED ORDER — SODIUM CHLORIDE 0.9 % IV SOLN
INTRAVENOUS | Status: DC
  Administered 2013-07-04: 07:00:00 via INTRAVENOUS

## 2013-07-03 MED ORDER — DEXTROSE 5 % IV SOLN
1.5000 g | INTRAVENOUS | Status: AC
  Administered 2013-07-04: 1.5 g via INTRAVENOUS
  Filled 2013-07-03: qty 1.5

## 2013-07-04 ENCOUNTER — Encounter (HOSPITAL_COMMUNITY): Payer: BC Managed Care – PPO | Admitting: Anesthesiology

## 2013-07-04 ENCOUNTER — Encounter (HOSPITAL_COMMUNITY): Payer: Self-pay | Admitting: Anesthesiology

## 2013-07-04 ENCOUNTER — Encounter (HOSPITAL_COMMUNITY)
Admission: RE | Disposition: A | Discharge status: Home / Self Care | Payer: Self-pay | Source: Ambulatory Visit | Attending: Vascular Surgery

## 2013-07-04 ENCOUNTER — Ambulatory Visit (HOSPITAL_COMMUNITY): Payer: BC Managed Care – PPO | Admitting: Anesthesiology

## 2013-07-04 ENCOUNTER — Other Ambulatory Visit: Payer: Self-pay | Admitting: *Deleted

## 2013-07-04 ENCOUNTER — Ambulatory Visit (HOSPITAL_COMMUNITY)
Admission: RE | Admit: 2013-07-04 | Discharge: 2013-07-04 | Disposition: A | Discharge status: Home / Self Care | Payer: BC Managed Care – PPO | Source: Ambulatory Visit | Attending: Vascular Surgery | Admitting: Vascular Surgery

## 2013-07-04 DIAGNOSIS — Z01810 Encounter for preprocedural cardiovascular examination: Secondary | ICD-10-CM

## 2013-07-04 DIAGNOSIS — N186 End stage renal disease: Secondary | ICD-10-CM

## 2013-07-04 DIAGNOSIS — N184 Chronic kidney disease, stage 4 (severe): Secondary | ICD-10-CM

## 2013-07-04 DIAGNOSIS — E119 Type 2 diabetes mellitus without complications: Secondary | ICD-10-CM | POA: Insufficient documentation

## 2013-07-04 DIAGNOSIS — E785 Hyperlipidemia, unspecified: Secondary | ICD-10-CM | POA: Insufficient documentation

## 2013-07-04 DIAGNOSIS — M545 Low back pain, unspecified: Secondary | ICD-10-CM | POA: Insufficient documentation

## 2013-07-04 DIAGNOSIS — E669 Obesity, unspecified: Secondary | ICD-10-CM | POA: Insufficient documentation

## 2013-07-04 DIAGNOSIS — I87309 Chronic venous hypertension (idiopathic) without complications of unspecified lower extremity: Secondary | ICD-10-CM | POA: Insufficient documentation

## 2013-07-04 DIAGNOSIS — K219 Gastro-esophageal reflux disease without esophagitis: Secondary | ICD-10-CM | POA: Insufficient documentation

## 2013-07-04 DIAGNOSIS — Z7901 Long term (current) use of anticoagulants: Secondary | ICD-10-CM | POA: Insufficient documentation

## 2013-07-04 DIAGNOSIS — Z95810 Presence of automatic (implantable) cardiac defibrillator: Secondary | ICD-10-CM | POA: Insufficient documentation

## 2013-07-04 DIAGNOSIS — T82898A Other specified complication of vascular prosthetic devices, implants and grafts, initial encounter: Secondary | ICD-10-CM

## 2013-07-04 DIAGNOSIS — Y832 Surgical operation with anastomosis, bypass or graft as the cause of abnormal reaction of the patient, or of later complication, without mention of misadventure at the time of the procedure: Secondary | ICD-10-CM | POA: Insufficient documentation

## 2013-07-04 DIAGNOSIS — I252 Old myocardial infarction: Secondary | ICD-10-CM | POA: Insufficient documentation

## 2013-07-04 DIAGNOSIS — I509 Heart failure, unspecified: Secondary | ICD-10-CM | POA: Insufficient documentation

## 2013-07-04 DIAGNOSIS — I129 Hypertensive chronic kidney disease with stage 1 through stage 4 chronic kidney disease, or unspecified chronic kidney disease: Secondary | ICD-10-CM | POA: Insufficient documentation

## 2013-07-04 DIAGNOSIS — I2510 Atherosclerotic heart disease of native coronary artery without angina pectoris: Secondary | ICD-10-CM | POA: Insufficient documentation

## 2013-07-04 DIAGNOSIS — Z7982 Long term (current) use of aspirin: Secondary | ICD-10-CM | POA: Insufficient documentation

## 2013-07-04 DIAGNOSIS — I739 Peripheral vascular disease, unspecified: Secondary | ICD-10-CM | POA: Insufficient documentation

## 2013-07-04 DIAGNOSIS — Z8673 Personal history of transient ischemic attack (TIA), and cerebral infarction without residual deficits: Secondary | ICD-10-CM | POA: Insufficient documentation

## 2013-07-04 DIAGNOSIS — Z86718 Personal history of other venous thrombosis and embolism: Secondary | ICD-10-CM | POA: Insufficient documentation

## 2013-07-04 HISTORY — DX: Presence of automatic (implantable) cardiac defibrillator: Z95.810

## 2013-07-04 HISTORY — DX: Heart failure, unspecified: I50.9

## 2013-07-04 HISTORY — DX: Unspecified cataract: H26.9

## 2013-07-04 HISTORY — PX: LIGATION OF ARTERIOVENOUS  FISTULA: SHX5948

## 2013-07-04 HISTORY — DX: Disorder of thyroid, unspecified: E07.9

## 2013-07-04 LAB — POCT I-STAT 4, (NA,K, GLUC, HGB,HCT)
GLUCOSE: 88 mg/dL (ref 70–99)
HCT: 41 % (ref 39.0–52.0)
Hemoglobin: 13.9 g/dL (ref 13.0–17.0)
POTASSIUM: 5.1 meq/L (ref 3.7–5.3)
Sodium: 142 mEq/L (ref 137–147)

## 2013-07-04 LAB — APTT: APTT: 36 s (ref 24–37)

## 2013-07-04 LAB — PROTIME-INR
INR: 1.37 (ref 0.00–1.49)
PROTHROMBIN TIME: 16.5 s — AB (ref 11.6–15.2)

## 2013-07-04 SURGERY — LIGATION OF ARTERIOVENOUS  FISTULA
Anesthesia: Monitor Anesthesia Care | Site: Arm Upper | Laterality: Left

## 2013-07-04 MED ORDER — MIDAZOLAM HCL 2 MG/2ML IJ SOLN
INTRAMUSCULAR | Status: AC
  Filled 2013-07-04: qty 2

## 2013-07-04 MED ORDER — PROPOFOL 10 MG/ML IV BOLUS
INTRAVENOUS | Status: AC
  Filled 2013-07-04: qty 20

## 2013-07-04 MED ORDER — PHENYLEPHRINE 40 MCG/ML (10ML) SYRINGE FOR IV PUSH (FOR BLOOD PRESSURE SUPPORT)
PREFILLED_SYRINGE | INTRAVENOUS | Status: AC
  Filled 2013-07-04: qty 10

## 2013-07-04 MED ORDER — FENTANYL CITRATE 0.05 MG/ML IJ SOLN
INTRAMUSCULAR | Status: AC
  Filled 2013-07-04: qty 5

## 2013-07-04 MED ORDER — THROMBIN 20000 UNITS EX SOLR
CUTANEOUS | Status: AC
  Filled 2013-07-04: qty 20000

## 2013-07-04 MED ORDER — OXYCODONE-ACETAMINOPHEN 5-325 MG PO TABS: 1.0000 | ORAL_TABLET | ORAL | Status: DC | PRN

## 2013-07-04 MED ORDER — PROPOFOL INFUSION 10 MG/ML OPTIME
INTRAVENOUS | Status: DC | PRN
  Administered 2013-07-04: 50 ug/kg/min via INTRAVENOUS

## 2013-07-04 MED ORDER — LIDOCAINE-EPINEPHRINE (PF) 1 %-1:200000 IJ SOLN
INTRAMUSCULAR | Status: DC | PRN
  Administered 2013-07-04: 30 mL

## 2013-07-04 MED ORDER — SUCCINYLCHOLINE CHLORIDE 20 MG/ML IJ SOLN
INTRAMUSCULAR | Status: AC
  Filled 2013-07-04: qty 1

## 2013-07-04 MED ORDER — 0.9 % SODIUM CHLORIDE (POUR BTL) OPTIME
TOPICAL | Status: DC | PRN
  Administered 2013-07-04: 1000 mL

## 2013-07-04 MED ORDER — EPHEDRINE SULFATE 50 MG/ML IJ SOLN
INTRAMUSCULAR | Status: AC
Start: 2013-07-04 — End: 2013-07-04
  Filled 2013-07-04: qty 1

## 2013-07-04 MED ORDER — MIDAZOLAM HCL 5 MG/5ML IJ SOLN
INTRAMUSCULAR | Status: DC | PRN
  Administered 2013-07-04: 2 mg via INTRAVENOUS

## 2013-07-04 MED ORDER — FENTANYL CITRATE 0.05 MG/ML IJ SOLN
INTRAMUSCULAR | Status: DC | PRN
  Administered 2013-07-04: 50 ug via INTRAVENOUS

## 2013-07-04 MED ORDER — HEPARIN SODIUM (PORCINE) 1000 UNIT/ML IJ SOLN
INTRAMUSCULAR | Status: AC
  Filled 2013-07-04: qty 1

## 2013-07-04 SURGICAL SUPPLY — 32 items
ADH SKN CLS APL DERMABOND .7 (GAUZE/BANDAGES/DRESSINGS) ×1
CANISTER SUCTION 2500CC (MISCELLANEOUS) ×3 IMPLANT
COVER SURGICAL LIGHT HANDLE (MISCELLANEOUS) ×3 IMPLANT
DERMABOND ADVANCED (GAUZE/BANDAGES/DRESSINGS) ×2
DERMABOND ADVANCED .7 DNX12 (GAUZE/BANDAGES/DRESSINGS) ×1 IMPLANT
ELECT REM PT RETURN 9FT ADLT (ELECTROSURGICAL) ×3
ELECTRODE REM PT RTRN 9FT ADLT (ELECTROSURGICAL) ×1 IMPLANT
GEL ULTRASOUND 20GR AQUASONIC (MISCELLANEOUS) IMPLANT
GLOVE BIO SURGEON STRL SZ7.5 (GLOVE) ×3 IMPLANT
GLOVE BIOGEL PI IND STRL 8 (GLOVE) ×1 IMPLANT
GLOVE BIOGEL PI INDICATOR 8 (GLOVE) ×2
GOWN STRL REUS W/ TWL LRG LVL3 (GOWN DISPOSABLE) ×3 IMPLANT
GOWN STRL REUS W/TWL LRG LVL3 (GOWN DISPOSABLE) ×9
KIT BASIN OR (CUSTOM PROCEDURE TRAY) ×3 IMPLANT
KIT ROOM TURNOVER OR (KITS) ×3 IMPLANT
NS IRRIG 1000ML POUR BTL (IV SOLUTION) ×3 IMPLANT
PACK CV ACCESS (CUSTOM PROCEDURE TRAY) ×3 IMPLANT
PAD ARMBOARD 7.5X6 YLW CONV (MISCELLANEOUS) ×6 IMPLANT
SPONGE GAUZE 4X4 12PLY (GAUZE/BANDAGES/DRESSINGS) ×3 IMPLANT
SPONGE SURGIFOAM ABS GEL 100 (HEMOSTASIS) IMPLANT
SUT ETHILON 3 0 PS 1 (SUTURE) IMPLANT
SUT PROLENE 6 0 BV (SUTURE) IMPLANT
SUT SILK 0 TIES 10X30 (SUTURE) ×3 IMPLANT
SUT VIC AB 3-0 SH 27 (SUTURE) ×3
SUT VIC AB 3-0 SH 27X BRD (SUTURE) ×1 IMPLANT
SUT VICRYL 4-0 PS2 18IN ABS (SUTURE) ×3 IMPLANT
SWAB COLLECTION DEVICE MRSA (MISCELLANEOUS) IMPLANT
TOWEL OR 17X24 6PK STRL BLUE (TOWEL DISPOSABLE) ×3 IMPLANT
TOWEL OR 17X26 10 PK STRL BLUE (TOWEL DISPOSABLE) ×3 IMPLANT
TUBE ANAEROBIC SPECIMEN COL (MISCELLANEOUS) IMPLANT
UNDERPAD 30X30 INCONTINENT (UNDERPADS AND DIAPERS) ×3 IMPLANT
WATER STERILE IRR 1000ML POUR (IV SOLUTION) ×3 IMPLANT

## 2013-07-04 NOTE — Op Note (Signed)
    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 Edilia Boickson, MD, FACS  ASSIST: none  ANESTHESIA: local with sedation    EBL: minimal  INDICATIONS: Willie [name] is a 63 y.o. male who presented with progressive swelling in the left upper extremity. 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 He also had mild steal symptoms. He is brought in for ligation of his fistula.  FINDINGS: fistula was patent and pulsatile.  TECHNIQUE: The patient was taken to the operating room and sedated by anesthesia. The left upper extremity was prepped and draped in usual sterile fashion. After the skin was infiltrated with 1% lidocaine, a transverse incision was made at the antecubital level. The proximal fistula was dissected free and ligated with 2-0 silk ties. Hemostasis was obtained the wound. The wound was closed the deep layer of 3-0 Vicryl and the skin closed with 4-0 Vicryl. Dermabond was applied. The patient tolerated the procedure well and was transferred to the recovery room in stable condition. All needle and sponge counts were correct.  Waverly Ferrarihristopher Marvene Strohm, MD, FACS [facility]  DATE OF DICTATION:   07/04/2013

## 2013-07-04 NOTE — Progress Notes (Signed)
Arlys JohnBrian here from [facility] to interrogate ICD

## 2013-07-04 NOTE — Preoperative (Signed)
Beta Blockers   Reason not to administer Beta Blockers:Not Applicable 

## 2013-07-04 NOTE — Discharge Instructions (Signed)
General Anesthesia, Adult, Care After  °Refer to this sheet in the next few weeks. These instructions provide you with information on caring for yourself after your procedure. Your health care provider may also give you more specific instructions. Your treatment has been planned according to current medical practices, but problems sometimes occur. Call your health care provider if you have any problems or questions after your procedure.  °WHAT TO EXPECT AFTER THE PROCEDURE  °After the procedure, it is typical to experience:  °Sleepiness.  °Nausea and vomiting. °HOME CARE INSTRUCTIONS  °For the first 24 hours after general anesthesia:  °Have a responsible person with you.  °Do not drive a car. If you are alone, do not take public transportation.  °Do not drink alcohol.  °Do not take medicine that has not been prescribed by your health care provider.  °Do not sign important papers or make important decisions.  °You may resume a normal diet and activities as directed by your health care provider.  °Change bandages (dressings) as directed.  °If you have questions or problems that seem related to general anesthesia, call the hospital and ask for the anesthetist or anesthesiologist on call. °SEEK MEDICAL CARE IF:  °You have nausea and vomiting that continue the day after anesthesia.  °You develop a rash. °SEEK IMMEDIATE MEDICAL CARE IF:  °You have difficulty breathing.  °You have chest pain.  °You have any allergic problems. °Document Released: 08/15/2000 Document Revised: 01/09/2013 Document Reviewed: 11/22/2012  °ExitCare® Patient Information ©2014 [employer].  ° °What to eat: ° °For your first meals, you should eat lightly; only small meals initially.  If you do not have nausea, you may eat larger meals.  Avoid spicy, greasy and heavy food.   ° °

## 2013-07-04 NOTE — Interval H&P Note (Signed)
History and Physical Interval Note:  07/04/2013 7:19 AM  Willie [name]  has presented today for surgery, with the diagnosis of Chronic kidney disease  The various methods of treatment have been discussed with the patient and family. After consideration of risks, benefits and other options for treatment, the patient has consented to  Procedure(s): LIGATION OF ARTERIOVENOUS  FISTULA (Left) as a surgical intervention .  The patient's history has been reviewed, patient examined, no change in status, stable for surgery.  I have reviewed the patient's chart and labs.  Questions were answered to the patient's satisfaction.     Nicholaos Schippers S

## 2013-07-04 NOTE — Progress Notes (Signed)
Willie GainsBryan [name] with [employer] paged per [employer] and informed pt is here.

## 2013-07-04 NOTE — Transfer of Care (Signed)
Immediate Anesthesia Transfer of Care Note  Patient: Willie [name]  Procedure(s) Performed: Procedure(s): LIGATION OF ARTERIOVENOUS  FISTULA (Left)  Patient Location: PACU  Anesthesia Type:MAC  Level of Consciousness: awake, alert  and oriented  Airway & Oxygen Therapy: Patient Spontanous Breathing and Patient connected to face mask oxygen  Post-op Assessment: Report given to PACU RN  Post vital signs: Reviewed and stable  Complications: No apparent anesthesia complications

## 2013-07-04 NOTE — H&P (View-Only) (Signed)
[facility] [place]  Patient name: Willie [name] MRN: [id number] DOB: 1951-01-12 Sex: male  REASON FOR VISIT: Follow up of left arm swelling  HPI: Willie ClimesClary L Mcilrath is a 63 y.o. male Who I last saw on 03/27/2013. He had a left brachiocephalic AV fistula placed in March of 2014. Essentially developed a DVT the left upper cherry extending from the brachial vein to the axillary vein. He was started on Coumadin. He was seen by Dr. Hart RochesterLawson on 06/04/2013. He was continued on Coumadin at that time. Continue to have problems with left arm swelling. He comes in for a follow up visit. He continues to have significant left upper tree swelling which has not improved. He is not yet on dialysis. He also complains of the left hand is cool at times.  Past Medical History  Diagnosis Date  . Hyperlipidemia   . Myocardial infarction   . Foot pain     while lying flat  . Peripheral vascular disease   . Anemia   . Obesity   . Hypertension     Dr. Carole BinningMeadows- seen early (last) - 07/2012  . H/O cardiovascular stress test 2014    [facility] [place]w Dr. Carole BinningMeadows  . Stroke     [facility]  . Diabetes mellitus     h/o , resolved since gastric bypass   . Chronic kidney disease     Dr. Darrick Pennaeterding- pt. remarks its stage 4   . GERD (gastroesophageal reflux disease)     uses pepcid PRN  . Arthritis     low back pain   . CAD (coronary artery disease)     pt. rec. care fr. Dr. Cheri KearnsShu & Dr. Carole BinningMeadows, plan for defib placement on 09/19/2012     Family History  Problem Relation Age of Onset  . Hypertension Mother   . Diabetes Father     Amputation  . Other Father     amputation   SOCIAL HISTORY: History  Substance Use Topics  . Smoking status: Never Smoker   . Smokeless tobacco: Never Used  . Alcohol Use: No   No Known Allergies Current Outpatient Prescriptions  Medication Sig Dispense Refill  . aspirin EC 81 MG tablet Take 81 mg by mouth daily before  breakfast.       . calcitRIOL (ROCALTROL) 0.5 MCG capsule Take 0.5 mcg by mouth every other day.       . Calcium Carbonate Antacid (TUMS PO) Take by mouth.      . enoxaparin (LOVENOX) 150 MG/ML injection Inject 0.5 mLs (75 mg total) into the skin daily.  4 Syringe  1  . famotidine (PEPCID) 20 MG tablet Take 20 mg by mouth 2 (two) times daily.      . furosemide (LASIX) 40 MG tablet Take 80 mg by mouth daily before breakfast.       . isosorbide mononitrate (IMDUR) 30 MG 24 hr tablet Take 30 mg by mouth daily before breakfast.       . lisinopril (PRINIVIL,ZESTRIL) 20 MG tablet Take 20 mg by mouth at bedtime.       . metoprolol (LOPRESSOR) 50 MG tablet Take 50 mg by mouth 2 (two) times daily.       Marland Kitchen. oxyCODONE-acetaminophen (ROXICET) 5-325 MG per tablet Take 1-2 tablets by mouth every 4 (four) hours as needed for pain.  20 tablet  0  . sevelamer carbonate (RENVELA) 800 MG tablet Take 800 mg by mouth  3 (three) times daily with meals.      . sodium bicarbonate 650 MG tablet Take 1,300 mg by mouth 2 (two) times daily.       . traZODone (DESYREL) 100 MG tablet Take 150 mg by mouth at bedtime.       Marland Kitchen. warfarin (COUMADIN) 5 MG tablet Take 5 mg by mouth daily. On Monday,Wednesday and Friday take 1 1/2 tablet   5mg  tablet.  On Tuesday, Thursday ,Saturday and Sunday take 1   5mg  tablet      . b complex-vitamin c-folic acid (NEPHRO-VITE) 0.8 MG TABS tablet Take 1 tablet by mouth at bedtime.       No current facility-administered medications for this visit.   REVIEW OF SYSTEMS: Arly.Keller[X ] denotes positive finding; [  ] denotes negative finding  CARDIOVASCULAR:  [ ]  chest pain   [ ]  chest pressure   [ ]  palpitations   [ ]  orthopnea   [ ]  dyspnea on exertion   [ ]  claudication   [ ]  rest pain   [ ]  DVT   [ ]  phlebitis PULMONARY:   [ ]  productive cough   [ ]  asthma   [ ]  wheezing NEUROLOGIC:   Arly.Keller[X ] weakness  [ ]  paresthesias  [ ]  aphasia  [ ]  amaurosis  [ ]  dizziness HEMATOLOGIC:   [ ]  bleeding problems   [ ]   clotting disorders MUSCULOSKELETAL:  [ ]  joint pain   [ ]  joint swelling [ ]  leg swelling GASTROINTESTINAL: [ ]   blood in stool  [ ]   hematemesis GENITOURINARY:  [ ]   dysuria  [ ]   hematuria PSYCHIATRIC:  [ ]  history of major depression INTEGUMENTARY:  [ ]  rashes  [ ]  ulcers CONSTITUTIONAL:  [ ]  fever   [ ]  chills  PHYSICAL EXAM: Filed Vitals:   06/26/13 1208  BP: 141/78  Pulse: 61  Resp: 18  Height: 6\' 1"  (1.854 m)  Weight: 181 lb (82.101 kg)   Body mass index is 23.89 kg/(m^2). GENERAL: The patient is a well-nourished male, in no acute distress. The vital signs are documented above. CARDIOVASCULAR: There is a regular rate and rhythm.  PULMONARY: There is good air exchange bilaterally without wheezing or rales. He has significant left upper extremity swelling all went to the shoulder. The left hand is slightly cool.   DATA:  I have independently interpreted his venous duplex scan of the left upper extremity. 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  MEDICAL ISSUES: End stage renal disease Given the worsening swelling his left upper tree with chronic occlusion in addition to mild steal symptoms, I recommended that we ligate his left upper arm AV fistula. We will stop his Coumadin 3 days prior to the procedure in his surgery has been scheduled for 07/04/2013. Once we know that his left arm has improved we can consider new access in the right arm after preop evaluation.    DICKSON,CHRISTOPHER S [facility] [place] [facility]: [phone]

## 2013-07-04 NOTE — Anesthesia Postprocedure Evaluation (Signed)
  Anesthesia Post-op Note  Patient: Willie [name]  Procedure(s) Performed: Procedure(s): LIGATION OF ARTERIOVENOUS  FISTULA (Left)  Patient Location: PACU  Anesthesia Type:MAC  Level of Consciousness: awake, alert  and oriented  Airway and Oxygen Therapy: Patient Spontanous Breathing  Post-op Pain: mild  Post-op Assessment: Post-op Vital signs reviewed, Patient's Cardiovascular Status Stable, Respiratory Function Stable, Patent Airway, No signs of Nausea or vomiting and Pain level controlled  Post-op Vital Signs: Reviewed and stable  Complications: No apparent anesthesia complications

## 2013-07-04 NOTE — Progress Notes (Signed)
Pt has ICD and pacer

## 2013-07-04 NOTE — Anesthesia Preprocedure Evaluation (Addendum)
Anesthesia Evaluation  Patient identified by MRN, date of birth, ID band Patient awake    Reviewed: Allergy & Precautions, H&P , NPO status , Patient's Chart, lab work & pertinent test results, reviewed documented beta blocker date and time   History of Anesthesia Complications Negative for: history of anesthetic complications  Airway Mallampati: II TM Distance: >3 FB Neck ROM: Full    Dental  (+) Teeth Intact   Pulmonary neg pulmonary ROS, neg shortness of breath, neg sleep apnea, neg COPD   Pulmonary exam normal       Cardiovascular hypertension, Pt. on home beta blockers and Pt. on medications - angina+ CAD, + Past MI, + Peripheral Vascular Disease and +CHF + Cardiac Defibrillator - Valvular Problems/MurmursRhythm:Regular Rate:Normal     Neuro/Psych Left sided weakness CVA, No Residual Symptoms negative psych ROS   GI/Hepatic Neg liver ROS, GERD-  Medicated and Controlled,  Endo/Other  diabetes, Type 2, Oral Hypoglycemic Agents  Renal/GU Renal InsufficiencyRenal disease     Musculoskeletal  (+) Arthritis -, Osteoarthritis,    Abdominal Normal abdominal exam  (+)   Peds  Hematology  (+) anemia ,   Anesthesia Other Findings AICD  Reproductive/Obstetrics                          Anesthesia Physical Anesthesia Plan  ASA: III  Anesthesia Plan: MAC   Post-op Pain Management:    Induction: Intravenous  Airway Management Planned: Mask  Additional Equipment:   Intra-op Plan:   Post-operative Plan:   Informed Consent: I have reviewed the patients History and Physical, chart, labs and discussed the procedure including the risks, benefits and alternatives for the proposed anesthesia with the patient or authorized representative who has indicated his/her understanding and acceptance.   Dental advisory given  Plan Discussed with: CRNA, Anesthesiologist and Surgeon  Anesthesia Plan  Comments:         Anesthesia Quick Evaluation

## 2013-07-05 ENCOUNTER — Encounter (HOSPITAL_COMMUNITY): Payer: Self-pay | Admitting: Vascular Surgery

## 2013-07-08 ENCOUNTER — Telehealth: Payer: Self-pay | Admitting: Vascular Surgery

## 2013-07-08 NOTE — Telephone Encounter (Addendum)
Message copied by Rosalyn ChartersOUX, BONNIE A on Mon Jul 08, 2013  1:38 PM ------      Message from: Kinnie ScalesOUX, BONNIE A      Created: Mon Jul 08, 2013 10:56 AM      Regarding: FW: Schedule                   ----- Message -----         From: Sharee PimpleMarilyn K McChesney, CMA         Sent: 07/04/2013   9:15 AM           To: [facility] Admin Pool      Subject: Schedule                                                             ----- Message -----         From: Chuck Hinthristopher S Dickson, MD         Sent: 07/04/2013   8:29 AM           To: [facility] Charge Pool      Subject: charge and f/u                                           He had ligation of his left upper arm AV fistula. He needs a follow up visit in 2-3 weeks with vein mapping of the right upper extremity. Thank you. CD ------  Notiifed patient of fu appt. On 07-24-13 at 11 am

## 2013-07-23 ENCOUNTER — Encounter: Payer: Self-pay | Admitting: Vascular Surgery

## 2013-07-24 ENCOUNTER — Ambulatory Visit (HOSPITAL_COMMUNITY)
Admission: RE | Admit: 2013-07-24 | Discharge: 2013-07-24 | Disposition: A | Discharge status: Home / Self Care | Payer: BC Managed Care – PPO | Source: Ambulatory Visit | Attending: Vascular Surgery | Admitting: Vascular Surgery

## 2013-07-24 ENCOUNTER — Other Ambulatory Visit: Payer: Self-pay | Admitting: *Deleted

## 2013-07-24 ENCOUNTER — Ambulatory Visit (INDEPENDENT_AMBULATORY_CARE_PROVIDER_SITE_OTHER): Payer: BC Managed Care – PPO | Admitting: Vascular Surgery

## 2013-07-24 ENCOUNTER — Encounter: Payer: Self-pay | Admitting: *Deleted

## 2013-07-24 ENCOUNTER — Encounter: Payer: Self-pay | Admitting: Vascular Surgery

## 2013-07-24 VITALS — BP 143/86 | HR 61 | Resp 16 | Ht 73.0 in | Wt 173.4 lb

## 2013-07-24 DIAGNOSIS — Z01818 Encounter for other preprocedural examination: Secondary | ICD-10-CM | POA: Insufficient documentation

## 2013-07-24 DIAGNOSIS — N189 Chronic kidney disease, unspecified: Secondary | ICD-10-CM | POA: Insufficient documentation

## 2013-07-24 DIAGNOSIS — N186 End stage renal disease: Secondary | ICD-10-CM

## 2013-07-24 DIAGNOSIS — Z01810 Encounter for preprocedural cardiovascular examination: Secondary | ICD-10-CM

## 2013-07-24 NOTE — Progress Notes (Signed)
[facility] [place]  Patient name: Willie [name] MRN: [id number] DOB: 1950-12-30 Sex: male  REASON FOR VISIT: Evaluate for new hemodialysis access.  HPI: Willie [name] is a 63 y.o. male had a left AV fistula ligated on 07/04/2013.  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 He had mild steal symptoms also.  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 Past Medical History  Diagnosis Date  . Hyperlipidemia   . Myocardial infarction   . Foot pain     while lying flat  . Peripheral vascular disease   . Anemia   . Obesity   . Hypertension     Dr. Carole BinningMeadows- seen early (last) - 07/2012  . H/O cardiovascular stress test 2014    [facility] [place] /w Dr. Carole BinningMeadows  . Stroke     [facility]  . Diabetes mellitus     h/o , resolved since gastric bypass   . Chronic kidney disease     Dr. Darrick Pennaeterding- pt. remarks its stage 4   . GERD (gastroesophageal reflux disease)     uses pepcid PRN  . Arthritis     low back pain   . CAD (coronary artery disease)     pt. rec. care fr. Dr. Cheri KearnsShu & Dr. Carole BinningMeadows, plan for defib placement on 09/19/2012    . Trace cataracts     Hx: of left  . CHF (congestive heart failure)   . Automatic implantable cardioverter-defibrillator in situ   . Thyroid condition     Hx: of 2005   Family History  Problem Relation Age of Onset  . Hypertension Mother   . Diabetes Father     Amputation  . Other Father     amputation   SOCIAL HISTORY: History  Substance Use Topics  . Smoking status: Never Smoker   . Smokeless tobacco: Never Used  . Alcohol Use: No   No Known Allergies Current Outpatient Prescriptions  Medication Sig Dispense Refill  . aspirin EC 81 MG tablet Take 81 mg by mouth daily before breakfast.       . calcitRIOL (ROCALTROL) 0.5 MCG capsule Take 0.5 mcg by mouth  every other day.       . famotidine (PEPCID) 20 MG tablet Take 20 mg by mouth 2 (two) times daily.      . furosemide (LASIX) 80 MG tablet Take 80 mg by mouth daily.      . isosorbide mononitrate (IMDUR) 30 MG 24 hr tablet Take 30 mg by mouth daily before breakfast.       . lisinopril (PRINIVIL,ZESTRIL) 20 MG tablet Take 20 mg by mouth at bedtime.       . metoprolol (LOPRESSOR) 50 MG tablet Take 50 mg by mouth 2 (two) times daily.       Marland Kitchen. oxyCODONE-acetaminophen (PERCOCET/ROXICET) 5-325 MG per tablet Take 1-2 tablets by mouth every 4 (four) hours as needed for pain.      Marland Kitchen. oxyCODONE-acetaminophen (ROXICET) 5-325 MG per tablet Take 1-2 tablets by mouth every 4 (four) hours as needed for severe pain.  20 tablet  0  . sevelamer carbonate (RENVELA) 800 MG tablet Take 1,600 mg by mouth 3 (three) times daily with meals.       . sodium bicarbonate 650 MG tablet Take 1,300 mg by  mouth 2 (two) times daily.       . traZODone (DESYREL) 100 MG tablet Take 100 mg by mouth at bedtime.       Marland Kitchen. warfarin (COUMADIN) 5 MG tablet Take 5 mg by mouth daily. On Monday,Wednesday and Friday take 1 1/2 tablet   5mg  tablet.  On Tuesday, Thursday ,Saturday and Sunday take 1   5mg  tablet       No current facility-administered medications for this visit.   REVIEW OF SYSTEMS: Arly.Keller[X ] denotes positive finding; [  ] denotes negative finding  CARDIOVASCULAR:  [ ]  chest pain   [ ]  chest pressure   [ ]  palpitations   [ ]  orthopnea   [ ]  dyspnea on exertion   [ ]  claudication   [ ]  rest pain   [ ]  DVT   [ ]  phlebitis PULMONARY:   [ ]  productive cough   [ ]  asthma   [ ]  wheezing NEUROLOGIC:   [ ]  weakness  [ ]  paresthesias  [ ]  aphasia  [ ]  amaurosis  [ ]  dizziness HEMATOLOGIC:   [ ]  bleeding problems   [ ]  clotting disorders MUSCULOSKELETAL:  [ ]  joint pain   [ ]  joint swelling [ ]  leg swelling GASTROINTESTINAL: [ ]   blood in stool  [ ]   hematemesis GENITOURINARY:  [ ]   dysuria  [ ]   hematuria PSYCHIATRIC:  [ ]  history of major  depression INTEGUMENTARY:  [ ]  rashes  [ ]  ulcers CONSTITUTIONAL:  [ ]  fever   [ ]  chills  PHYSICAL EXAM: Filed Vitals:   07/24/13 1205  BP: 143/86  Pulse: 61  Resp: 16  Height: 6\' 1"  (1.854 m)  Weight: 173 lb 6.4 oz (78.654 kg)   Body mass index is 22.88 kg/(m^2). GENERAL: The patient is a well-nourished male, in no acute distress. The vital signs are documented above. CARDIOVASCULAR: There is a regular rate and rhythm. He has a palpable right radial pulse. His swelling in the left upper extremity has improved significantly. PULMONARY: There is good air exchange bilaterally without wheezing or rales. ABDOMEN: Soft and non-tender with normal pitched bowel sounds.  MUSCULOSKELETAL: There are no major deformities or cyanosis. NEUROLOGIC: No focal weakness or paresthesias are detected. SKIN: There are no ulcers or rashes noted. PSYCHIATRIC: The patient has a normal affect.  DATA:  His previous vein mapping shows that his forearm and upper arm cephalic vein on the right is very small and likely not adequate for a fistula. Basilic vein is marginal in size.  MEDICAL ISSUES:  End stage renal disease He is not yet on dialysis but reportedly his renal function is worsening. He may not be a candidate for a fistula. I think his best chance for a fistula in the right upper extremity is a basilic vein transposition. If this is not adequate however he would require placement of an AV graft. Given the problems he had in the left arm with a DVT, we will obtain a venogram on the right with limited contrast to be sure there is no central venous stenosis on the right. Once this is done we can schedule him for right AV fistula or a right AV graft.  His venogram has been scheduled for 07/29/2013.  DICKSON,CHRISTOPHER S [facility] [place] [facility]: [phone]

## 2013-07-24 NOTE — Assessment & Plan Note (Signed)
He is not yet on dialysis but reportedly his renal function is worsening. He may not be a candidate for a fistula. I think his best chance for a fistula in the right upper extremity is a basilic vein transposition. If this is not adequate however he would require placement of an AV graft. Given the problems he had in the left arm with a DVT, we will obtain a venogram on the right with limited contrast to be sure there is no central venous stenosis on the right. Once this is done we can schedule him for right AV fistula or a right AV graft.

## 2013-07-25 ENCOUNTER — Encounter (HOSPITAL_COMMUNITY): Payer: Self-pay

## 2013-07-29 ENCOUNTER — Ambulatory Visit (HOSPITAL_COMMUNITY)
Admission: RE | Admit: 2013-07-29 | Discharge: 2013-07-29 | Disposition: A | Discharge status: Home / Self Care | Payer: BC Managed Care – PPO | Source: Ambulatory Visit | Attending: Vascular Surgery | Admitting: Vascular Surgery

## 2013-07-29 ENCOUNTER — Encounter (HOSPITAL_COMMUNITY)
Admission: RE | Disposition: A | Discharge status: Home / Self Care | Payer: Self-pay | Source: Ambulatory Visit | Attending: Vascular Surgery

## 2013-07-29 DIAGNOSIS — I509 Heart failure, unspecified: Secondary | ICD-10-CM | POA: Insufficient documentation

## 2013-07-29 DIAGNOSIS — E669 Obesity, unspecified: Secondary | ICD-10-CM | POA: Insufficient documentation

## 2013-07-29 DIAGNOSIS — Z7901 Long term (current) use of anticoagulants: Secondary | ICD-10-CM | POA: Insufficient documentation

## 2013-07-29 DIAGNOSIS — I82B29 Chronic embolism and thrombosis of unspecified subclavian vein: Secondary | ICD-10-CM | POA: Insufficient documentation

## 2013-07-29 DIAGNOSIS — Z7982 Long term (current) use of aspirin: Secondary | ICD-10-CM | POA: Insufficient documentation

## 2013-07-29 DIAGNOSIS — E785 Hyperlipidemia, unspecified: Secondary | ICD-10-CM | POA: Insufficient documentation

## 2013-07-29 DIAGNOSIS — N186 End stage renal disease: Secondary | ICD-10-CM | POA: Insufficient documentation

## 2013-07-29 DIAGNOSIS — I739 Peripheral vascular disease, unspecified: Secondary | ICD-10-CM | POA: Insufficient documentation

## 2013-07-29 DIAGNOSIS — D649 Anemia, unspecified: Secondary | ICD-10-CM | POA: Insufficient documentation

## 2013-07-29 DIAGNOSIS — H269 Unspecified cataract: Secondary | ICD-10-CM | POA: Insufficient documentation

## 2013-07-29 DIAGNOSIS — I120 Hypertensive chronic kidney disease with stage 5 chronic kidney disease or end stage renal disease: Secondary | ICD-10-CM | POA: Insufficient documentation

## 2013-07-29 DIAGNOSIS — Z8673 Personal history of transient ischemic attack (TIA), and cerebral infarction without residual deficits: Secondary | ICD-10-CM | POA: Insufficient documentation

## 2013-07-29 DIAGNOSIS — I82290 Acute embolism and thrombosis of other thoracic veins: Secondary | ICD-10-CM | POA: Insufficient documentation

## 2013-07-29 DIAGNOSIS — I252 Old myocardial infarction: Secondary | ICD-10-CM | POA: Insufficient documentation

## 2013-07-29 DIAGNOSIS — T82898A Other specified complication of vascular prosthetic devices, implants and grafts, initial encounter: Secondary | ICD-10-CM

## 2013-07-29 DIAGNOSIS — I2510 Atherosclerotic heart disease of native coronary artery without angina pectoris: Secondary | ICD-10-CM | POA: Insufficient documentation

## 2013-07-29 DIAGNOSIS — I82A29 Chronic embolism and thrombosis of unspecified axillary vein: Secondary | ICD-10-CM | POA: Insufficient documentation

## 2013-07-29 DIAGNOSIS — E119 Type 2 diabetes mellitus without complications: Secondary | ICD-10-CM | POA: Insufficient documentation

## 2013-07-29 HISTORY — PX: VENOGRAM: SHX5497

## 2013-07-29 LAB — POCT I-STAT, CHEM 8
BUN: 54 mg/dL — AB (ref 6–23)
CALCIUM ION: 1.06 mmol/L — AB (ref 1.13–1.30)
CHLORIDE: 106 meq/L (ref 96–112)
Creatinine, Ser: 4.4 mg/dL — ABNORMAL HIGH (ref 0.50–1.35)
Glucose, Bld: 84 mg/dL (ref 70–99)
HCT: 38 % — ABNORMAL LOW (ref 39.0–52.0)
Hemoglobin: 12.9 g/dL — ABNORMAL LOW (ref 13.0–17.0)
Potassium: 5.1 mEq/L (ref 3.7–5.3)
SODIUM: 141 meq/L (ref 137–147)
TCO2: 22 mmol/L (ref 0–100)

## 2013-07-29 LAB — PROTIME-INR
INR: 1.25 (ref 0.00–1.49)
PROTHROMBIN TIME: 15.4 s — AB (ref 11.6–15.2)

## 2013-07-29 SURGERY — VENOGRAM
Anesthesia: LOCAL | Laterality: Right

## 2013-07-29 MED ORDER — SODIUM CHLORIDE 0.9 % IV SOLN: 250.0000 mL | INTRAVENOUS | Status: DC | PRN

## 2013-07-29 MED ORDER — SODIUM CHLORIDE 0.9 % IJ SOLN: 3.0000 mL | Freq: Two times a day (BID) | INTRAMUSCULAR | Status: DC

## 2013-07-29 MED ORDER — SODIUM CHLORIDE 0.9 % IJ SOLN: 3.0000 mL | INTRAMUSCULAR | Status: DC | PRN

## 2013-07-29 NOTE — Interval H&P Note (Signed)
History and Physical Interval Note:  07/29/2013 8:34 AM  Willie [name]  has presented today for surgery, with the diagnosis of chronic kidney disease/ not on HD yet  The various methods of treatment have been discussed with the patient and family. After consideration of risks, benefits and other options for treatment, the patient has consented to  Procedure(s): VENOGRAM (Right) as a surgical intervention .  The patient's history has been reviewed, patient examined, no change in status, stable for surgery.  I have reviewed the patient's chart and labs.  Questions were answered to the patient's satisfaction.     DICKSON,CHRISTOPHER S

## 2013-07-29 NOTE — Discharge Instructions (Signed)
Venogram, Care After ° °Refer to this sheet in the next few weeks. These instructions provide you with information on caring for yourself after your procedure. Your health care provider may also give you more specific instructions. Your treatment has been planned according to current medical practices, but problems sometimes occur. Call your health care provider if you have any problems or questions after your procedure. °WHAT TO EXPECT AFTER THE PROCEDURE °After your procedure, it is typical to have the following sensations: °· Mild discomfort at the catheter insertion site. °HOME CARE INSTRUCTIONS  °· Take all medicines exactly as directed. °· Follow any prescribed diet. °· Follow instructions regarding both rest and physical activity. °· Drink more fluids for the first several days after the procedure, in order to help flush dye from your kidneys. °SEEK MEDICAL CARE IF: °· You develop a rash. °SEEK IMMEDIATE MEDICAL CARE IF °· You have fever not controlled by medicine. °· There is pain, drainage, bleeding, redness, swelling, warmth or a red streak at the site of the IV tube. °· The extremity where your IV tube was placed becomes discolored, numb, or cool. °· You have difficulty breathing or shortness of breath. °· You develop chest pain. °· You have excessive dizziness or fainting. °Document Released: 02/27/2013 Document Reviewed: 01/14/2013 °ExitCare® Patient Information ©2014 [employer]. ° °

## 2013-07-29 NOTE — H&P (View-Only) (Signed)
[facility] [place]  Patient name: Willie [name] MRN: [id number] DOB: 1950-12-30 Sex: male  REASON FOR VISIT: Evaluate for new hemodialysis access.  HPI: Willie [name] is a 63 y.o. male had a left AV fistula ligated on 07/04/2013.  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 He had mild steal symptoms also.  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 Past Medical History  Diagnosis Date  . Hyperlipidemia   . Myocardial infarction   . Foot pain     while lying flat  . Peripheral vascular disease   . Anemia   . Obesity   . Hypertension     Dr. Carole BinningMeadows- seen early (last) - 07/2012  . H/O cardiovascular stress test 2014    thru [facility] [place] /w Dr. Carole BinningMeadows  . Stroke     [facility]  . Diabetes mellitus     h/o , resolved since gastric bypass   . Chronic kidney disease     Dr. Darrick Pennaeterding- pt. remarks its stage 4   . GERD (gastroesophageal reflux disease)     uses pepcid PRN  . Arthritis     low back pain   . CAD (coronary artery disease)     pt. rec. care fr. Dr. Cheri KearnsShu & Dr. Carole BinningMeadows, plan for defib placement on 09/19/2012    . Trace cataracts     Hx: of left  . CHF (congestive heart failure)   . Automatic implantable cardioverter-defibrillator in situ   . Thyroid condition     Hx: of 2005   Family History  Problem Relation Age of Onset  . Hypertension Mother   . Diabetes Father     Amputation  . Other Father     amputation   SOCIAL HISTORY: History  Substance Use Topics  . Smoking status: Never Smoker   . Smokeless tobacco: Never Used  . Alcohol Use: No   No Known Allergies Current Outpatient Prescriptions  Medication Sig Dispense Refill  . aspirin EC 81 MG tablet Take 81 mg by mouth daily before breakfast.       . calcitRIOL (ROCALTROL) 0.5 MCG capsule Take 0.5 mcg by mouth  every other day.       . famotidine (PEPCID) 20 MG tablet Take 20 mg by mouth 2 (two) times daily.      . furosemide (LASIX) 80 MG tablet Take 80 mg by mouth daily.      . isosorbide mononitrate (IMDUR) 30 MG 24 hr tablet Take 30 mg by mouth daily before breakfast.       . lisinopril (PRINIVIL,ZESTRIL) 20 MG tablet Take 20 mg by mouth at bedtime.       . metoprolol (LOPRESSOR) 50 MG tablet Take 50 mg by mouth 2 (two) times daily.       Marland Kitchen. oxyCODONE-acetaminophen (PERCOCET/ROXICET) 5-325 MG per tablet Take 1-2 tablets by mouth every 4 (four) hours as needed for pain.      Marland Kitchen. oxyCODONE-acetaminophen (ROXICET) 5-325 MG per tablet Take 1-2 tablets by mouth every 4 (four) hours as needed for severe pain.  20 tablet  0  . sevelamer carbonate (RENVELA) 800 MG tablet Take 1,600 mg by mouth 3 (three) times daily with meals.       . sodium bicarbonate 650 MG tablet Take 1,300 mg by  mouth 2 (two) times daily.       . traZODone (DESYREL) 100 MG tablet Take 100 mg by mouth at bedtime.       Marland Kitchen. warfarin (COUMADIN) 5 MG tablet Take 5 mg by mouth daily. On Monday,Wednesday and Friday take 1 1/2 tablet   5mg  tablet.  On Tuesday, Thursday ,Saturday and Sunday take 1   5mg  tablet       No current facility-administered medications for this visit.   REVIEW OF SYSTEMS: Arly.Keller[X ] denotes positive finding; [  ] denotes negative finding  CARDIOVASCULAR:  [ ]  chest pain   [ ]  chest pressure   [ ]  palpitations   [ ]  orthopnea   [ ]  dyspnea on exertion   [ ]  claudication   [ ]  rest pain   [ ]  DVT   [ ]  phlebitis PULMONARY:   [ ]  productive cough   [ ]  asthma   [ ]  wheezing NEUROLOGIC:   [ ]  weakness  [ ]  paresthesias  [ ]  aphasia  [ ]  amaurosis  [ ]  dizziness HEMATOLOGIC:   [ ]  bleeding problems   [ ]  clotting disorders MUSCULOSKELETAL:  [ ]  joint pain   [ ]  joint swelling [ ]  leg swelling GASTROINTESTINAL: [ ]   blood in stool  [ ]   hematemesis GENITOURINARY:  [ ]   dysuria  [ ]   hematuria PSYCHIATRIC:  [ ]  history of major  depression INTEGUMENTARY:  [ ]  rashes  [ ]  ulcers CONSTITUTIONAL:  [ ]  fever   [ ]  chills  PHYSICAL EXAM: Filed Vitals:   07/24/13 1205  BP: 143/86  Pulse: 61  Resp: 16  Height: 6\' 1"  (1.854 m)  Weight: 173 lb 6.4 oz (78.654 kg)   Body mass index is 22.88 kg/(m^2). GENERAL: The patient is a well-nourished male, in no acute distress. The vital signs are documented above. CARDIOVASCULAR: There is a regular rate and rhythm. He has a palpable right radial pulse. His swelling in the left upper extremity has improved significantly. PULMONARY: There is good air exchange bilaterally without wheezing or rales. ABDOMEN: Soft and non-tender with normal pitched bowel sounds.  MUSCULOSKELETAL: There are no major deformities or cyanosis. NEUROLOGIC: No focal weakness or paresthesias are detected. SKIN: There are no ulcers or rashes noted. PSYCHIATRIC: The patient has a normal affect.  DATA:  His previous vein mapping shows that his forearm and upper arm cephalic vein on the right is very small and likely not adequate for a fistula. Basilic vein is marginal in size.  MEDICAL ISSUES:  End stage renal disease He is not yet on dialysis but reportedly his renal function is worsening. He may not be a candidate for a fistula. I think his best chance for a fistula in the right upper extremity is a basilic vein transposition. If this is not adequate however he would require placement of an AV graft. Given the problems he had in the left arm with a DVT, we will obtain a venogram on the right with limited contrast to be sure there is no central venous stenosis on the right. Once this is done we can schedule him for right AV fistula or a right AV graft.  His venogram has been scheduled for 07/29/2013.  DICKSON,CHRISTOPHER S [facility] [place] Beeper: [phone]

## 2013-07-29 NOTE — Op Note (Signed)
   PATIENT: Willie [name]   MRN: [id number] DOB: 19-Jun-1950    DATE OF PROCEDURE: 07/29/2013  INDICATIONS: Willie [name] is a 63 y.o. male who had a AV access in the left arm and developed significant arm swelling. He had developed a DVT and therefore ultimately, this graft had to be ligated and the left arm. Before placing access in the right arm I elected to proceed with a central venogram.  PROCEDURE: central venogram right upper extremity  SURGEON: Di Kindlehristopher S. Edilia Boickson, MD, FACS  ANESTHESIA: none   EBL: none  TECHNIQUE: The patient had a peripheral IV placed in the right hand. He was brought to the peripheral vascular lab and a central venogram was performed by injecting contrast through the IV. A total of 20 cc of contrast was used. The vein was examined from the elbow to the central veins.  FINDINGS:  1. The brachial vein at the antecubital level was reasonable size. The upper arm cephalic vein and basilic vein were very small. The right brachial cephalic vein was occluded.  CLINICAL NOTE: given the central venous occlusion on the right he is not a candidate for access in the right arm. He'll ultimately need a thigh graft when he is closer to needing dialysis.  Willie Ferrarihristopher Dickson, MD, FACS [facility]  DATE OF DICTATION:   07/29/2013

## 2013-08-07 ENCOUNTER — Telehealth: Payer: Self-pay

## 2013-08-07 DIAGNOSIS — Z01810 Encounter for preprocedural cardiovascular examination: Secondary | ICD-10-CM

## 2013-08-07 DIAGNOSIS — N186 End stage renal disease: Secondary | ICD-10-CM

## 2013-08-07 NOTE — Telephone Encounter (Addendum)
Per request of Dr. Darrick Pennaeterding to have permanent HD access placement, reviewed pt's recent Venogram with Dr. Edilia Boickson.  It was noted that pt. "will need a thigh graft when closer to needing dialysis."  Per recommendation Dr. Edilia Boickson, pt. Will need pre-operative ABI's to check the blood flow in the legs, to establish baseline, and assist in determining which leg would be best for thigh graft placement.  "Pt. can be scheduled for ABI's only, and if normal, can be scheduled for Insertion Right Thigh AVG; if ABI's abnormal, pt. to see MD."  Phone call to pt's wife.  Explained the plan for pre-op ABI's. Wife verb. understanding.

## 2013-08-09 ENCOUNTER — Other Ambulatory Visit: Payer: Self-pay | Admitting: *Deleted

## 2013-08-09 ENCOUNTER — Encounter: Payer: Self-pay | Admitting: *Deleted

## 2013-08-09 ENCOUNTER — Ambulatory Visit (HOSPITAL_COMMUNITY)
Admission: RE | Admit: 2013-08-09 | Discharge: 2013-08-09 | Disposition: A | Discharge status: Home / Self Care | Payer: BC Managed Care – PPO | Source: Ambulatory Visit | Attending: Vascular Surgery | Admitting: Vascular Surgery

## 2013-08-09 DIAGNOSIS — N186 End stage renal disease: Secondary | ICD-10-CM | POA: Insufficient documentation

## 2013-08-09 DIAGNOSIS — Z01810 Encounter for preprocedural cardiovascular examination: Secondary | ICD-10-CM

## 2013-08-09 DIAGNOSIS — Z01818 Encounter for other preprocedural examination: Secondary | ICD-10-CM | POA: Insufficient documentation

## 2013-08-12 ENCOUNTER — Telehealth: Payer: Self-pay | Admitting: *Deleted

## 2013-08-12 ENCOUNTER — Encounter (HOSPITAL_COMMUNITY): Payer: Self-pay | Admitting: Pharmacy Technician

## 2013-08-12 NOTE — Telephone Encounter (Signed)
Jan (RN) from Dr. Nino ParsleyKelling's office states that Mr. Willie Glassmanhelps may come off Coumadin "the least amount of days possible" prior to surgery and that he should be restarted on Coumadin at at his regular dose after his surgical procedure.  Jan states that since he is a dialysis patient that Lovenox "is not indicated."

## 2013-08-19 ENCOUNTER — Encounter (HOSPITAL_COMMUNITY): Payer: Self-pay | Admitting: *Deleted

## 2013-08-19 MED ORDER — DEXTROSE 5 % IV SOLN
1.5000 g | INTRAVENOUS | Status: AC
  Administered 2013-08-20: 1.5 g via INTRAVENOUS
  Filled 2013-08-19: qty 1.5

## 2013-08-19 NOTE — Progress Notes (Signed)
08/19/13 1158  OBSTRUCTIVE SLEEP APNEA  Have you ever been diagnosed with sleep apnea through a sleep study? No  Do you snore loudly (loud enough to be heard through closed doors)?  0  Do you often feel tired, fatigued, or sleepy during the daytime? 0  Has anyone observed you stop breathing during your sleep? 1  Do you have, or are you being treated for high blood pressure? 1  BMI more than 35 kg/m2? 0  Age over 62 years old? 1  Neck circumference greater than 40 cm/18 inches? 0  Gender: 1  Obstructive Sleep Apnea Score 4  Score 4 or greater  Results sent to PCP

## 2013-08-20 ENCOUNTER — Encounter (HOSPITAL_COMMUNITY)
Admission: RE | Disposition: A | Discharge status: Home / Self Care | Payer: Self-pay | Source: Ambulatory Visit | Attending: Vascular Surgery

## 2013-08-20 ENCOUNTER — Observation Stay (HOSPITAL_COMMUNITY)
Admission: RE | Admit: 2013-08-20 | Discharge: 2013-08-21 | Disposition: A | Discharge status: Home / Self Care | Payer: BC Managed Care – PPO | Source: Ambulatory Visit | Attending: Vascular Surgery | Admitting: Vascular Surgery

## 2013-08-20 ENCOUNTER — Inpatient Hospital Stay (HOSPITAL_COMMUNITY): Payer: BC Managed Care – PPO

## 2013-08-20 ENCOUNTER — Encounter (HOSPITAL_COMMUNITY): Payer: Self-pay | Admitting: Certified Registered Nurse Anesthetist

## 2013-08-20 ENCOUNTER — Inpatient Hospital Stay (HOSPITAL_COMMUNITY): Payer: BC Managed Care – PPO | Admitting: Certified Registered Nurse Anesthetist

## 2013-08-20 ENCOUNTER — Encounter (HOSPITAL_COMMUNITY): Payer: BC Managed Care – PPO | Admitting: Certified Registered Nurse Anesthetist

## 2013-08-20 DIAGNOSIS — E669 Obesity, unspecified: Secondary | ICD-10-CM | POA: Insufficient documentation

## 2013-08-20 DIAGNOSIS — I2510 Atherosclerotic heart disease of native coronary artery without angina pectoris: Secondary | ICD-10-CM | POA: Insufficient documentation

## 2013-08-20 DIAGNOSIS — Z95810 Presence of automatic (implantable) cardiac defibrillator: Secondary | ICD-10-CM | POA: Insufficient documentation

## 2013-08-20 DIAGNOSIS — E119 Type 2 diabetes mellitus without complications: Secondary | ICD-10-CM | POA: Insufficient documentation

## 2013-08-20 DIAGNOSIS — I129 Hypertensive chronic kidney disease with stage 1 through stage 4 chronic kidney disease, or unspecified chronic kidney disease: Principal | ICD-10-CM | POA: Insufficient documentation

## 2013-08-20 DIAGNOSIS — N186 End stage renal disease: Secondary | ICD-10-CM | POA: Diagnosis present

## 2013-08-20 DIAGNOSIS — N184 Chronic kidney disease, stage 4 (severe): Secondary | ICD-10-CM

## 2013-08-20 DIAGNOSIS — Z8673 Personal history of transient ischemic attack (TIA), and cerebral infarction without residual deficits: Secondary | ICD-10-CM | POA: Insufficient documentation

## 2013-08-20 DIAGNOSIS — K219 Gastro-esophageal reflux disease without esophagitis: Secondary | ICD-10-CM | POA: Insufficient documentation

## 2013-08-20 DIAGNOSIS — E785 Hyperlipidemia, unspecified: Secondary | ICD-10-CM | POA: Insufficient documentation

## 2013-08-20 DIAGNOSIS — Z86718 Personal history of other venous thrombosis and embolism: Secondary | ICD-10-CM | POA: Insufficient documentation

## 2013-08-20 DIAGNOSIS — I509 Heart failure, unspecified: Secondary | ICD-10-CM | POA: Insufficient documentation

## 2013-08-20 HISTORY — DX: Low back pain, unspecified: M54.50

## 2013-08-20 HISTORY — DX: Chronic kidney disease, stage 4 (severe): N18.4

## 2013-08-20 HISTORY — PX: AV FISTULA PLACEMENT: SHX1204

## 2013-08-20 HISTORY — DX: Low back pain: M54.5

## 2013-08-20 HISTORY — DX: Other chronic pain: G89.29

## 2013-08-20 LAB — POCT I-STAT 4, (NA,K, GLUC, HGB,HCT)
GLUCOSE: 89 mg/dL (ref 70–99)
HEMATOCRIT: 43 % (ref 39.0–52.0)
HEMOGLOBIN: 14.6 g/dL (ref 13.0–17.0)
Potassium: 4.8 mEq/L (ref 3.7–5.3)
SODIUM: 141 meq/L (ref 137–147)

## 2013-08-20 LAB — PROTIME-INR
INR: 1.08 (ref 0.00–1.49)
Prothrombin Time: 13.8 seconds (ref 11.6–15.2)

## 2013-08-20 LAB — GLUCOSE, CAPILLARY
GLUCOSE-CAPILLARY: 102 mg/dL — AB (ref 70–99)
Glucose-Capillary: 82 mg/dL (ref 70–99)
Glucose-Capillary: 69 mg/dL — ABNORMAL LOW (ref 70–99)

## 2013-08-20 LAB — APTT: aPTT: 32 seconds (ref 24–37)

## 2013-08-20 SURGERY — INSERTION OF ARTERIOVENOUS (AV) GORE-TEX GRAFT THIGH
Anesthesia: General | Site: Leg Upper | Laterality: Right

## 2013-08-20 MED ORDER — PROPOFOL 10 MG/ML IV BOLUS
INTRAVENOUS | Status: AC
  Filled 2013-08-20: qty 20

## 2013-08-20 MED ORDER — ACETAMINOPHEN 325 MG PO TABS: 325.0000 mg | ORAL_TABLET | ORAL | Status: DC | PRN

## 2013-08-20 MED ORDER — CHLORHEXIDINE GLUCONATE 4 % EX LIQD
60.0000 mL | Freq: Once | CUTANEOUS | Status: DC
  Filled 2013-08-20: qty 60

## 2013-08-20 MED ORDER — PROTAMINE SULFATE 10 MG/ML IV SOLN
INTRAVENOUS | Status: DC | PRN
  Administered 2013-08-20: 40 mg via INTRAVENOUS

## 2013-08-20 MED ORDER — WARFARIN SODIUM 5 MG PO TABS
5.0000 mg | ORAL_TABLET | ORAL | Status: DC
  Administered 2013-08-20: 5 mg via ORAL
  Filled 2013-08-20: qty 1

## 2013-08-20 MED ORDER — OXYCODONE HCL 5 MG/5ML PO SOLN: 5.0000 mg | Freq: Once | ORAL | Status: DC | PRN

## 2013-08-20 MED ORDER — OXYCODONE HCL 5 MG PO TABS
5.0000 mg | ORAL_TABLET | ORAL | Status: DC | PRN
  Administered 2013-08-20 (×2): 10 mg via ORAL
  Filled 2013-08-20 (×2): qty 2

## 2013-08-20 MED ORDER — ONDANSETRON HCL 4 MG/2ML IJ SOLN
INTRAMUSCULAR | Status: AC
  Filled 2013-08-20: qty 2

## 2013-08-20 MED ORDER — ACETAMINOPHEN 650 MG RE SUPP: 325.0000 mg | RECTAL | Status: DC | PRN

## 2013-08-20 MED ORDER — ONDANSETRON HCL 4 MG/2ML IJ SOLN: 4.0000 mg | Freq: Four times a day (QID) | INTRAMUSCULAR | Status: DC | PRN

## 2013-08-20 MED ORDER — FENTANYL CITRATE 0.05 MG/ML IJ SOLN: 50.0000 ug | Freq: Once | INTRAMUSCULAR | Status: DC

## 2013-08-20 MED ORDER — FAMOTIDINE 20 MG PO TABS
20.0000 mg | ORAL_TABLET | Freq: Every day | ORAL | Status: DC | PRN
  Filled 2013-08-20: qty 1

## 2013-08-20 MED ORDER — SODIUM CHLORIDE 0.9 % IJ SOLN: 3.0000 mL | INTRAMUSCULAR | Status: DC | PRN

## 2013-08-20 MED ORDER — DEXTROSE 50 % IV SOLN
INTRAVENOUS | Status: AC
  Filled 2013-08-20: qty 50

## 2013-08-20 MED ORDER — FENTANYL CITRATE 0.05 MG/ML IJ SOLN
INTRAMUSCULAR | Status: DC | PRN
  Administered 2013-08-20: 50 ug via INTRAVENOUS

## 2013-08-20 MED ORDER — SODIUM BICARBONATE 650 MG PO TABS
1300.0000 mg | ORAL_TABLET | Freq: Two times a day (BID) | ORAL | Status: DC
  Administered 2013-08-20: 1300 mg via ORAL
  Filled 2013-08-20 (×3): qty 2

## 2013-08-20 MED ORDER — OXYCODONE HCL 5 MG PO TABS: 5.0000 mg | ORAL_TABLET | Freq: Four times a day (QID) | ORAL | Status: AC | PRN

## 2013-08-20 MED ORDER — HEPARIN SODIUM (PORCINE) 5000 UNIT/ML IJ SOLN
INTRAMUSCULAR | Status: DC | PRN
  Administered 2013-08-20: 11:00:00

## 2013-08-20 MED ORDER — SEVELAMER CARBONATE 800 MG PO TABS
1600.0000 mg | ORAL_TABLET | Freq: Three times a day (TID) | ORAL | Status: DC
  Administered 2013-08-20: 1600 mg via ORAL
  Filled 2013-08-20 (×5): qty 2

## 2013-08-20 MED ORDER — SODIUM CHLORIDE 0.9 % IJ SOLN
3.0000 mL | Freq: Two times a day (BID) | INTRAMUSCULAR | Status: DC
  Administered 2013-08-20: 3 mL via INTRAVENOUS

## 2013-08-20 MED ORDER — SODIUM CHLORIDE 0.9 % IV SOLN: 250.0000 mL | INTRAVENOUS | Status: DC | PRN

## 2013-08-20 MED ORDER — PHENOL 1.4 % MT LIQD
1.0000 | OROMUCOSAL | Status: DC | PRN
  Filled 2013-08-20: qty 177

## 2013-08-20 MED ORDER — MIDAZOLAM HCL 2 MG/2ML IJ SOLN
INTRAMUSCULAR | Status: AC
  Filled 2013-08-20: qty 2

## 2013-08-20 MED ORDER — OXYCODONE HCL 5 MG PO TABS: 5.0000 mg | ORAL_TABLET | Freq: Once | ORAL | Status: DC | PRN

## 2013-08-20 MED ORDER — SODIUM CHLORIDE 0.9 % IV SOLN
INTRAVENOUS | Status: DC
  Administered 2013-08-20: 09:00:00 via INTRAVENOUS

## 2013-08-20 MED ORDER — DEXTROSE 50 % IV SOLN
12.5000 g | Freq: Once | INTRAVENOUS | Status: AC
  Administered 2013-08-20: 12.5 g via INTRAVENOUS

## 2013-08-20 MED ORDER — SENNA 8.6 MG PO TABS
1.0000 | ORAL_TABLET | Freq: Two times a day (BID) | ORAL | Status: DC
  Administered 2013-08-20: 8.6 mg via ORAL
  Filled 2013-08-20 (×3): qty 1

## 2013-08-20 MED ORDER — TRAZODONE HCL 100 MG PO TABS
100.0000 mg | ORAL_TABLET | Freq: Every day | ORAL | Status: DC
  Administered 2013-08-20: 100 mg via ORAL
  Filled 2013-08-20 (×2): qty 1

## 2013-08-20 MED ORDER — FUROSEMIDE 80 MG PO TABS
80.0000 mg | ORAL_TABLET | Freq: Every day | ORAL | Status: DC
Start: 2013-08-20 — End: 2013-08-21
  Administered 2013-08-20: 80 mg via ORAL
  Filled 2013-08-20 (×2): qty 1

## 2013-08-20 MED ORDER — PROPOFOL 10 MG/ML IV BOLUS
INTRAVENOUS | Status: DC | PRN
  Administered 2013-08-20: 160 mg via INTRAVENOUS

## 2013-08-20 MED ORDER — FENTANYL CITRATE 0.05 MG/ML IJ SOLN: 25.0000 ug | INTRAMUSCULAR | Status: DC | PRN

## 2013-08-20 MED ORDER — LISINOPRIL 20 MG PO TABS
20.0000 mg | ORAL_TABLET | Freq: Every day | ORAL | Status: DC
  Administered 2013-08-20: 20 mg via ORAL
  Filled 2013-08-20 (×2): qty 1

## 2013-08-20 MED ORDER — MORPHINE SULFATE 2 MG/ML IJ SOLN: 2.0000 mg | INTRAMUSCULAR | Status: DC | PRN

## 2013-08-20 MED ORDER — THROMBIN 20000 UNITS EX SOLR
CUTANEOUS | Status: AC
  Filled 2013-08-20: qty 20000

## 2013-08-20 MED ORDER — ONDANSETRON HCL 4 MG/2ML IJ SOLN
INTRAMUSCULAR | Status: DC | PRN
  Administered 2013-08-20: 4 mg via INTRAVENOUS

## 2013-08-20 MED ORDER — ASPIRIN EC 81 MG PO TBEC
81.0000 mg | DELAYED_RELEASE_TABLET | Freq: Every day | ORAL | Status: DC
  Administered 2013-08-21: 81 mg via ORAL
  Filled 2013-08-20 (×2): qty 1

## 2013-08-20 MED ORDER — ACETAMINOPHEN 160 MG/5ML PO SOLN
325.0000 mg | ORAL | Status: DC | PRN
  Filled 2013-08-20: qty 20.3

## 2013-08-20 MED ORDER — PHENYLEPHRINE HCL 10 MG/ML IJ SOLN
INTRAMUSCULAR | Status: DC | PRN
  Administered 2013-08-20: 80 ug via INTRAVENOUS
  Administered 2013-08-20 (×2): 40 ug via INTRAVENOUS
  Administered 2013-08-20 (×3): 80 ug via INTRAVENOUS
  Administered 2013-08-20: 40 ug via INTRAVENOUS

## 2013-08-20 MED ORDER — GUAIFENESIN-DM 100-10 MG/5ML PO SYRP: 15.0000 mL | ORAL_SOLUTION | ORAL | Status: DC | PRN

## 2013-08-20 MED ORDER — WARFARIN SODIUM 7.5 MG PO TABS
7.5000 mg | ORAL_TABLET | ORAL | Status: DC
  Filled 2013-08-20: qty 1

## 2013-08-20 MED ORDER — HEPARIN SODIUM (PORCINE) 1000 UNIT/ML IJ SOLN
INTRAMUSCULAR | Status: DC | PRN
  Administered 2013-08-20: 7000 [IU] via INTRAVENOUS

## 2013-08-20 MED ORDER — ISOSORBIDE MONONITRATE ER 30 MG PO TB24
30.0000 mg | ORAL_TABLET | Freq: Every day | ORAL | Status: DC
  Administered 2013-08-21: 30 mg via ORAL
  Filled 2013-08-20 (×2): qty 1

## 2013-08-20 MED ORDER — LABETALOL HCL 5 MG/ML IV SOLN
10.0000 mg | INTRAVENOUS | Status: DC | PRN
  Filled 2013-08-20: qty 4

## 2013-08-20 MED ORDER — ONDANSETRON HCL 4 MG/2ML IJ SOLN: 4.0000 mg | Freq: Once | INTRAMUSCULAR | Status: DC | PRN

## 2013-08-20 MED ORDER — 0.9 % SODIUM CHLORIDE (POUR BTL) OPTIME
TOPICAL | Status: DC | PRN
  Administered 2013-08-20: 1000 mL

## 2013-08-20 MED ORDER — ALUM & MAG HYDROXIDE-SIMETH 200-200-20 MG/5ML PO SUSP: 15.0000 mL | ORAL | Status: DC | PRN

## 2013-08-20 MED ORDER — HYDRALAZINE HCL 20 MG/ML IJ SOLN: 10.0000 mg | INTRAMUSCULAR | Status: DC | PRN

## 2013-08-20 MED ORDER — SODIUM CHLORIDE 0.9 % IV SOLN
INTRAVENOUS | Status: DC
  Administered 2013-08-20 (×2): via INTRAVENOUS

## 2013-08-20 MED ORDER — ALBUMIN HUMAN 5 % IV SOLN
INTRAVENOUS | Status: DC | PRN
  Administered 2013-08-20: 12:00:00 via INTRAVENOUS

## 2013-08-20 MED ORDER — WARFARIN - PHYSICIAN DOSING INPATIENT: Freq: Every day | Status: DC

## 2013-08-20 MED ORDER — METOPROLOL TARTRATE 1 MG/ML IV SOLN: 2.0000 mg | INTRAVENOUS | Status: DC | PRN

## 2013-08-20 MED ORDER — METOPROLOL TARTRATE 50 MG PO TABS
50.0000 mg | ORAL_TABLET | Freq: Two times a day (BID) | ORAL | Status: DC
  Administered 2013-08-20: 50 mg via ORAL
  Filled 2013-08-20 (×3): qty 1

## 2013-08-20 MED ORDER — MIDAZOLAM HCL 2 MG/2ML IJ SOLN: 1.0000 mg | INTRAMUSCULAR | Status: DC | PRN

## 2013-08-20 MED ORDER — MIDAZOLAM HCL 5 MG/5ML IJ SOLN
INTRAMUSCULAR | Status: DC | PRN
  Administered 2013-08-20: 2 mg via INTRAVENOUS

## 2013-08-20 MED ORDER — LIDOCAINE HCL (CARDIAC) 20 MG/ML IV SOLN
INTRAVENOUS | Status: DC | PRN
  Administered 2013-08-20: 100 mg via INTRAVENOUS

## 2013-08-20 MED ORDER — FENTANYL CITRATE 0.05 MG/ML IJ SOLN
INTRAMUSCULAR | Status: AC
  Filled 2013-08-20: qty 5

## 2013-08-20 SURGICAL SUPPLY — 44 items
ADH SKN CLS APL DERMABOND .7 (GAUZE/BANDAGES/DRESSINGS) ×1
BLADE SURG ROTATE 9660 (MISCELLANEOUS) ×2 IMPLANT
CANISTER SUCTION 2500CC (MISCELLANEOUS) ×3 IMPLANT
CLIP TI MEDIUM 6 (CLIP) ×3 IMPLANT
CLIP TI WIDE RED SMALL 6 (CLIP) ×3 IMPLANT
COVER SURGICAL LIGHT HANDLE (MISCELLANEOUS) ×3 IMPLANT
DERMABOND ADVANCED (GAUZE/BANDAGES/DRESSINGS) ×2
DERMABOND ADVANCED .7 DNX12 (GAUZE/BANDAGES/DRESSINGS) ×1 IMPLANT
DRAPE INCISE IOBAN 66X45 STRL (DRAPES) ×6 IMPLANT
ELECT REM PT RETURN 9FT ADLT (ELECTROSURGICAL) ×3
ELECTRODE REM PT RTRN 9FT ADLT (ELECTROSURGICAL) ×1 IMPLANT
GLOVE BIO SURGEON STRL SZ 6.5 (GLOVE) ×1 IMPLANT
GLOVE BIO SURGEON STRL SZ7.5 (GLOVE) ×3 IMPLANT
GLOVE BIO SURGEONS STRL SZ 6.5 (GLOVE) ×1
GLOVE BIOGEL PI IND STRL 6.5 (GLOVE) IMPLANT
GLOVE BIOGEL PI IND STRL 7.0 (GLOVE) IMPLANT
GLOVE BIOGEL PI IND STRL 8 (GLOVE) ×1 IMPLANT
GLOVE BIOGEL PI INDICATOR 6.5 (GLOVE) ×4
GLOVE BIOGEL PI INDICATOR 7.0 (GLOVE) ×6
GLOVE BIOGEL PI INDICATOR 8 (GLOVE) ×2
GLOVE ECLIPSE 6.5 STRL STRAW (GLOVE) ×6 IMPLANT
GOWN STRL REUS W/ TWL LRG LVL3 (GOWN DISPOSABLE) ×3 IMPLANT
GOWN STRL REUS W/ TWL XL LVL3 (GOWN DISPOSABLE) IMPLANT
GOWN STRL REUS W/TWL LRG LVL3 (GOWN DISPOSABLE) ×9
GOWN STRL REUS W/TWL XL LVL3 (GOWN DISPOSABLE) ×12
GRAFT GORETEX STRT 4-7X45 (Vascular Products) ×2 IMPLANT
KIT BASIN OR (CUSTOM PROCEDURE TRAY) ×3 IMPLANT
KIT ROOM TURNOVER OR (KITS) ×3 IMPLANT
LOOP VESSEL MAXI BLUE (MISCELLANEOUS) ×2 IMPLANT
NS IRRIG 1000ML POUR BTL (IV SOLUTION) ×3 IMPLANT
PACK CV ACCESS (CUSTOM PROCEDURE TRAY) ×3 IMPLANT
PAD ARMBOARD 7.5X6 YLW CONV (MISCELLANEOUS) ×6 IMPLANT
SPONGE SURGIFOAM ABS GEL 100 (HEMOSTASIS) IMPLANT
SUT PROLENE 6 0 BV (SUTURE) ×8 IMPLANT
SUT VIC AB 2-0 CTB1 (SUTURE) ×3 IMPLANT
SUT VIC AB 3-0 SH 27 (SUTURE) ×6
SUT VIC AB 3-0 SH 27X BRD (SUTURE) ×2 IMPLANT
SUT VICRYL 4-0 PS2 18IN ABS (SUTURE) ×6 IMPLANT
TAPE CLOTH SURG 6X10 WHT LF (GAUZE/BANDAGES/DRESSINGS) ×2 IMPLANT
TAPE STRIPS DRAPE STRL (GAUZE/BANDAGES/DRESSINGS) ×2 IMPLANT
TOWEL OR 17X24 6PK STRL BLUE (TOWEL DISPOSABLE) ×3 IMPLANT
TOWEL OR 17X26 10 PK STRL BLUE (TOWEL DISPOSABLE) ×3 IMPLANT
UNDERPAD 30X30 INCONTINENT (UNDERPADS AND DIAPERS) ×3 IMPLANT
WATER STERILE IRR 1000ML POUR (IV SOLUTION) ×3 IMPLANT

## 2013-08-20 NOTE — Progress Notes (Signed)
   VASCULAR POSTOP NOTE:  SUBJECTIVE: Pain well controlled. No pain in right foot  PHYSICAL EXAM: Filed Vitals:   08/20/13 1400 08/20/13 1401 08/20/13 1403 08/20/13 1415  BP:  126/62  136/66  Pulse: 71 73  78  Temp:   97.4 F (36.3 C) 98 F (36.7 C)  TempSrc:      Resp: 14   18  Height:      Weight:      SpO2: 100% 100%  96%   Good thrill in right thigh AVG Incisions look fine Right foot adequatley perfused.  LABS: Lab Results  Component Value Date   WBC 3.5* 08/14/2012   HGB 14.6 08/20/2013   HCT 43.0 08/20/2013   MCV 92.4 08/14/2012   PLT 121* 08/14/2012   Lab Results  Component Value Date   CREATININE 4.40* 07/29/2013   Lab Results  Component Value Date   INR 1.08 08/20/2013   CBG (last 3)   Recent Labs  08/20/13 0958 08/20/13 1251 08/20/13 1341  GLUCAP 82 69* 102*   Active Problems:   ESRD (end stage renal disease)  ASSESSMENT AND PLAN:  * Stable post op  * plan D/C in AM  Cari Carawayhris Darin Arndt Beeper: [phone] 08/20/2013

## 2013-08-20 NOTE — Op Note (Signed)
    NAME: Piedad ClimesClary L Krolak    MRN: [id number] DOB: December 20, 1950    DATE OF OPERATION: 08/20/2013  PREOP DIAGNOSIS: Stage IV chronic kidney disease  POSTOP DIAGNOSIS: same  PROCEDURE: new right thigh AV graft (4-7 mm PTFE graft)  SURGEON: Di Kindlehristopher S. Edilia Boickson, MD, FACS  ASSIST: Doreatha MassedSamantha Rhyne, PA  ANESTHESIA: Gen.   EBL: minimal  INDICATIONS: Piedad ClimesClary L Boback is a 63 y.o. male who is not yet on dialysis. He presents for access. He had a previous left upper extremity DVT. He had a central venogram on the right which showed again a central venous occlusion on the right. Therefore it was felt that his only option was a thigh graft.  FINDINGS: He had diffuse disease of his common femoral superficial femoral and deep femoral arteries. There was one soft spot in the distal common femoral artery where the anastomosis was performed. Venous anastomosis was in the very proximal saphenous vein.  TECHNIQUE: The patient was taken to the operating room and received a general anesthetic. The right thigh was prepped and draped in usual sterile fashion. A longitudinal incision was made in the right groin. Through this incision the common femoral, superficial femoral and deep femoral arteries were dissected free. They were all markedly calcified with diffuse atherosclerotic disease. There was one soft spot in the distal common femoral artery which had a reasonable pulse. Through the same incision the saphenofemoral junction a proximal saphenous vein were dissected free. Using 1 distal counterincision a 4-7 mm PTFE graft was tunneled in a loop fashion in the thigh with the arterial aspect of the graft along the lateral aspect of the thigh. The patient was heparinized. The common femoral, superficial femoral, and deep femoral arteries were controlled. A longitudinal arteriotomy was made in the soft spot. A segment of the 4 mm and the graft was excised the graft slightly spatulated and sewn end to side to the common  femoral artery using continuous 6-0 Prolene suture. Prior to completing the anastomosis I flushed the inflow and there was good inflow. The graft was clamped and the anastomosis completed. The graft was then pulled the appropriate length for anastomosis to the saphenous vein. The vein was somewhat sclerotic and therefore a the venotomy on the saphenous vein up to the saphenofemoral junction. The vein had been ligated distally and divided. The 7 mm and the graft was spatulated and sewn end-to-side to the very proximal greater saphenous vein using continuous 6-0 Prolene suture. At the completion was a good thrill in the graft. The heparin was partially reversed with protamine. The wounds closed the deep layer of 2-0 Vicryl. The subcutaneous layer was closed with 3-0 Vicryl. The skin was closed with 4-0 subcuticular stitch. Dermabond was applied. The patient tolerated the procedure well and was transferred to the recovery room in stable condition. All needle and sponge counts were correct.  Waverly Ferrarihristopher Jacarra Bobak, MD, FACS [facility]  DATE OF DICTATION:   08/20/2013

## 2013-08-20 NOTE — Transfer of Care (Signed)
Immediate Anesthesia Transfer of Care Note  Patient: Willie [name]  Procedure(s) Performed: Procedure(s): INSERTION OF RIGHT  ARTERIOVENOUS (AV) GORE-TEX THIGH GRAFT (Right)  Patient Location: PACU  Anesthesia Type:General  Level of Consciousness: awake, alert  and oriented  Airway & Oxygen Therapy: Patient Spontanous Breathing  Post-op Assessment: Report given to PACU RN  Post vital signs: Reviewed and stable  Complications: No apparent anesthesia complications

## 2013-08-20 NOTE — Anesthesia Postprocedure Evaluation (Signed)
  Anesthesia Post-op Note  Patient: Willie [name]  Procedure(s) Performed: Procedure(s): INSERTION OF RIGHT  ARTERIOVENOUS (AV) GORE-TEX THIGH GRAFT (Right)  Patient Location: PACU  Anesthesia Type:General  Level of Consciousness: awake and alert   Airway and Oxygen Therapy: Patient Spontanous Breathing  Post-op Pain: mild  Post-op Assessment: Post-op Vital signs reviewed, Patient's Cardiovascular Status Stable, Respiratory Function Stable, Patent Airway, No signs of Nausea or vomiting and Pain level controlled  Post-op Vital Signs: Reviewed and stable  Complications: No apparent anesthesia complications

## 2013-08-20 NOTE — H&P (View-Only) (Signed)
[facility] [place]  Patient name: Willie [name] MRN: [id number] DOB: 1950-12-30 Sex: male  REASON FOR VISIT: Evaluate for new hemodialysis access.  HPI: Willie [name] is a 63 y.o. male had a left AV fistula ligated on 07/04/2013.  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 He had mild steal symptoms also.  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 Past Medical History  Diagnosis Date  . Hyperlipidemia   . Myocardial infarction   . Foot pain     while lying flat  . Peripheral vascular disease   . Anemia   . Obesity   . Hypertension     Dr. Carole BinningMeadows- seen early (last) - 07/2012  . H/O cardiovascular stress test 2014    thru [facility] [place] /w Dr. Carole BinningMeadows  . Stroke     [facility]  . Diabetes mellitus     h/o , resolved since gastric bypass   . Chronic kidney disease     Dr. Darrick Pennaeterding- pt. remarks its stage 4   . GERD (gastroesophageal reflux disease)     uses pepcid PRN  . Arthritis     low back pain   . CAD (coronary artery disease)     pt. rec. care fr. Dr. Cheri KearnsShu & Dr. Carole BinningMeadows, plan for defib placement on 09/19/2012    . Trace cataracts     Hx: of left  . CHF (congestive heart failure)   . Automatic implantable cardioverter-defibrillator in situ   . Thyroid condition     Hx: of 2005   Family History  Problem Relation Age of Onset  . Hypertension Mother   . Diabetes Father     Amputation  . Other Father     amputation   SOCIAL HISTORY: History  Substance Use Topics  . Smoking status: Never Smoker   . Smokeless tobacco: Never Used  . Alcohol Use: No   No Known Allergies Current Outpatient Prescriptions  Medication Sig Dispense Refill  . aspirin EC 81 MG tablet Take 81 mg by mouth daily before breakfast.       . calcitRIOL (ROCALTROL) 0.5 MCG capsule Take 0.5 mcg by mouth  every other day.       . famotidine (PEPCID) 20 MG tablet Take 20 mg by mouth 2 (two) times daily.      . furosemide (LASIX) 80 MG tablet Take 80 mg by mouth daily.      . isosorbide mononitrate (IMDUR) 30 MG 24 hr tablet Take 30 mg by mouth daily before breakfast.       . lisinopril (PRINIVIL,ZESTRIL) 20 MG tablet Take 20 mg by mouth at bedtime.       . metoprolol (LOPRESSOR) 50 MG tablet Take 50 mg by mouth 2 (two) times daily.       Marland Kitchen. oxyCODONE-acetaminophen (PERCOCET/ROXICET) 5-325 MG per tablet Take 1-2 tablets by mouth every 4 (four) hours as needed for pain.      Marland Kitchen. oxyCODONE-acetaminophen (ROXICET) 5-325 MG per tablet Take 1-2 tablets by mouth every 4 (four) hours as needed for severe pain.  20 tablet  0  . sevelamer carbonate (RENVELA) 800 MG tablet Take 1,600 mg by mouth 3 (three) times daily with meals.       . sodium bicarbonate 650 MG tablet Take 1,300 mg by  mouth 2 (two) times daily.       . traZODone (DESYREL) 100 MG tablet Take 100 mg by mouth at bedtime.       Marland Kitchen. warfarin (COUMADIN) 5 MG tablet Take 5 mg by mouth daily. On Monday,Wednesday and Friday take 1 1/2 tablet   5mg  tablet.  On Tuesday, Thursday ,Saturday and Sunday take 1   5mg  tablet       No current facility-administered medications for this visit.   REVIEW OF SYSTEMS: Arly.Keller[X ] denotes positive finding; [  ] denotes negative finding  CARDIOVASCULAR:  [ ]  chest pain   [ ]  chest pressure   [ ]  palpitations   [ ]  orthopnea   [ ]  dyspnea on exertion   [ ]  claudication   [ ]  rest pain   [ ]  DVT   [ ]  phlebitis PULMONARY:   [ ]  productive cough   [ ]  asthma   [ ]  wheezing NEUROLOGIC:   [ ]  weakness  [ ]  paresthesias  [ ]  aphasia  [ ]  amaurosis  [ ]  dizziness HEMATOLOGIC:   [ ]  bleeding problems   [ ]  clotting disorders MUSCULOSKELETAL:  [ ]  joint pain   [ ]  joint swelling [ ]  leg swelling GASTROINTESTINAL: [ ]   blood in stool  [ ]   hematemesis GENITOURINARY:  [ ]   dysuria  [ ]   hematuria PSYCHIATRIC:  [ ]  history of major  depression INTEGUMENTARY:  [ ]  rashes  [ ]  ulcers CONSTITUTIONAL:  [ ]  fever   [ ]  chills  PHYSICAL EXAM: Filed Vitals:   07/24/13 1205  BP: 143/86  Pulse: 61  Resp: 16  Height: 6\' 1"  (1.854 m)  Weight: 173 lb 6.4 oz (78.654 kg)   Body mass index is 22.88 kg/(m^2). GENERAL: The patient is a well-nourished male, in no acute distress. The vital signs are documented above. CARDIOVASCULAR: There is a regular rate and rhythm. He has a palpable right radial pulse. His swelling in the left upper extremity has improved significantly. PULMONARY: There is good air exchange bilaterally without wheezing or rales. ABDOMEN: Soft and non-tender with normal pitched bowel sounds.  MUSCULOSKELETAL: There are no major deformities or cyanosis. NEUROLOGIC: No focal weakness or paresthesias are detected. SKIN: There are no ulcers or rashes noted. PSYCHIATRIC: The patient has a normal affect.  DATA:  His previous vein mapping shows that his forearm and upper arm cephalic vein on the right is very small and likely not adequate for a fistula. Basilic vein is marginal in size.  MEDICAL ISSUES:  End stage renal disease He is not yet on dialysis but reportedly his renal function is worsening. He may not be a candidate for a fistula. I think his best chance for a fistula in the right upper extremity is a basilic vein transposition. If this is not adequate however he would require placement of an AV graft. Given the problems he had in the left arm with a DVT, we will obtain a venogram on the right with limited contrast to be sure there is no central venous stenosis on the right. Once this is done we can schedule him for right AV fistula or a right AV graft.  His venogram has been scheduled for 07/29/2013.  DICKSON,CHRISTOPHER S [facility] [place] Beeper: [phone]

## 2013-08-20 NOTE — Discharge Instructions (Signed)
° ° °  08/20/2013 Willie [name] [id number] Oct 02, 1950  Surgeon(s): Chuck Hinthristopher S Dickson, MD  Procedure(s): INSERTION OF RIGHT  ARTERIOVENOUS (AV) GORE-TEX THIGH GRAFT-right  x Do not stick graft for 4 weeks

## 2013-08-20 NOTE — Interval H&P Note (Signed)
History and Physical Interval Note:  08/20/2013 10:10 AM  Willie [name]  has presented today for surgery, with the diagnosis of ESRD  The various methods of treatment have been discussed with the patient and family. After consideration of risks, benefits and other options for treatment, the patient has consented to  Procedure(s): INSERTION OF ARTERIOVENOUS (AV) GORE-TEX GRAFT THIGH (Right) as a surgical intervention .  The patient's history has been reviewed, patient examined, no change in status, stable for surgery.  I have reviewed the patient's chart and labs.  Questions were answered to the patient's satisfaction.     Whitley Strycharz S

## 2013-08-20 NOTE — Anesthesia Preprocedure Evaluation (Signed)
Anesthesia Evaluation  Patient identified by MRN, date of birth, ID band Patient awake    Reviewed: Allergy & Precautions, H&P , NPO status , Patient's Chart, lab work & pertinent test results, reviewed documented beta blocker date and time   History of Anesthesia Complications Negative for: history of anesthetic complications  Airway Mallampati: II TM Distance: >3 FB Neck ROM: Full    Dental  (+) Teeth Intact   Pulmonary neg pulmonary ROS, neg shortness of breath, neg sleep apnea, neg COPD breath sounds clear to auscultation  Pulmonary exam normal       Cardiovascular hypertension, Pt. on home beta blockers and Pt. on medications - angina+ CAD, + Past MI, + Peripheral Vascular Disease and +CHF + Cardiac Defibrillator - Valvular Problems/MurmursRhythm:Regular Rate:Normal     Neuro/Psych Left sided weakness CVA, No Residual Symptoms negative psych ROS   GI/Hepatic Neg liver ROS, GERD-  Medicated and Controlled,  Endo/Other  diabetes, Type 2, Oral Hypoglycemic Agents  Renal/GU CRFRenal disease     Musculoskeletal  (+) Arthritis -, Osteoarthritis,    Abdominal Normal abdominal exam  (+)   Peds  Hematology  (+) anemia ,   Anesthesia Other Findings AICD  Reproductive/Obstetrics                           Anesthesia Physical Anesthesia Plan  ASA: III  Anesthesia Plan: General   Post-op Pain Management:    Induction: Intravenous  Airway Management Planned: LMA  Additional Equipment:   Intra-op Plan:   Post-operative Plan: Extubation in OR  Informed Consent: I have reviewed the patients History and Physical, chart, labs and discussed the procedure including the risks, benefits and alternatives for the proposed anesthesia with the patient or authorized representative who has indicated his/her understanding and acceptance.     Plan Discussed with: CRNA and Surgeon  Anesthesia Plan  Comments:         Anesthesia Quick Evaluation

## 2013-08-21 ENCOUNTER — Telehealth: Payer: Self-pay | Admitting: Vascular Surgery

## 2013-08-21 ENCOUNTER — Encounter (HOSPITAL_COMMUNITY): Payer: Self-pay | Admitting: Vascular Surgery

## 2013-08-21 NOTE — Progress Notes (Signed)
   VASCULAR PROGRESS NOTE  SUBJECTIVE: Comfortable  PHYSICAL EXAM: Filed Vitals:   08/20/13 1403 08/20/13 1415 08/20/13 2113 08/21/13 0508  BP:  136/66 119/60 110/63  Pulse:  78 80 77  Temp: 97.4 F (36.3 C) 98 F (36.7 C) 98.9 F (37.2 C) 97.3 F (36.3 C)  TempSrc:   Oral Oral  Resp:  18 18 17   Height:      Weight:      SpO2:  96% 99% 99%   Incisions look fine Good thrill in right AVG  LABS: Lab Results  Component Value Date   WBC 3.5* 08/14/2012   HGB 14.6 08/20/2013   HCT 43.0 08/20/2013   MCV 92.4 08/14/2012   PLT 121* 08/14/2012   Lab Results  Component Value Date   CREATININE 4.40* 07/29/2013   Lab Results  Component Value Date   INR 1.08 08/20/2013   CBG (last 3)   Recent Labs  08/20/13 0958 08/20/13 1251 08/20/13 1341  GLUCAP 82 69* 102*    Active Problems:   ESRD (end stage renal disease)   ASSESSMENT AND PLAN:  * 1 Day Post-Op s/p: New right thigh AVG  *  D/C this AM  Cari Carawayhris Cortlan Dolin Beeper: [phone] 08/21/2013

## 2013-08-21 NOTE — Discharge Summary (Signed)
[facility] Discharge Summary  Willie [name] [birth year] 63 y.o. male  [id number]  Admission Date: 08/20/2013  Discharge Date: 08/21/13  Physician: Chuck Hint, MD  Admission Diagnosis: ESRD   HPI:   This is a 63 y.o. male who had a left AV fistula ligated on 07/04/2013. 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 He had mild steal symptoms also.  Hospital Course:  The patient was admitted to the hospital and taken to the operating room on 08/20/2013 and underwent: new right thigh AV graft (4-7 mm PTFE graft)    The pt tolerated the procedure well and was transported to the PACU in good condition. His post operative course was unremarkable and he was discharged home on POD 1.  The remainder of the hospital course consisted of increasing mobilization and increasing intake of solids without difficulty.  CBC    Component Value Date/Time   WBC 3.5* 08/14/2012 1514   RBC 4.06* 08/14/2012 1514   HGB 14.6 08/20/2013 0757   HCT 43.0 08/20/2013 0757   PLT 121* 08/14/2012 1514   MCV 92.4 08/14/2012 1514   MCH 30.3 08/14/2012 1514   MCHC 32.8 08/14/2012 1514   RDW 12.9 08/14/2012 1514    BMET    Component Value Date/Time   NA 141 08/20/2013 0757   K 4.8 08/20/2013 0757   CL 106 07/29/2013 0748   CO2 27 08/14/2012 1514   GLUCOSE 89 08/20/2013 0757   BUN 54* 07/29/2013 0748   CREATININE 4.40* 07/29/2013 0748   CALCIUM 8.6 08/14/2012 1514   GFRNONAA 14* 08/14/2012 1514   GFRAA 17* 08/14/2012 1514     Discharge Instructions:   The patient is discharged to home with extensive instructions on wound care and progressive ambulation.  They are instructed not to drive or perform any heavy lifting until returning to see the physician in his office.  Discharge Orders   Future Orders Complete By Expires   Call MD for:  redness, tenderness, or signs of infection  (pain, swelling, bleeding, redness, odor or green/yellow discharge around incision site)  As directed    Call MD for:  severe or increased pain, loss or decreased feeling  in affected limb(s)  As directed    Call MD for:  temperature >100.5  As directed    Discharge wound care:  As directed    Comments:     Wash the groin wound with soap and water daily and pat dry. (No tub bath-only shower)  Then put a dry gauze or washcloth there to keep this area dry daily and as needed.  Do not use Vaseline or neosporin on your incisions.  Only use soap and water on your incisions and then protect and keep dry.   Driving Restrictions  As directed    Comments:     No driving for 24 hours and while taking pain medication.   Lifting restrictions  As directed    Comments:     No lifting for 4 weeks   Resume previous diet  As directed       Discharge Diagnosis:  ESRD  Secondary Diagnosis: Patient Active Problem List   Diagnosis Date Noted  . ESRD (end stage renal disease) 08/20/2013  . DVT (deep venous thrombosis) 03/27/2013  . Chronic kidney disease (CKD), stage IV (severe) 03/20/2013  . Mechanical complication of other vascular device, implant, and  graft 09/26/2012  . Numbness and tingling in left arm 09/26/2012  . Pain in limb 08/24/2012  . Cold hands 08/24/2012  . Swelling of limb 08/24/2012  . End stage renal disease 08/08/2012  . ANEMIA, IRON DEFICIENCY 10/09/2008  . GERD 10/09/2008  . BLOOD IN STOOL, OCCULT 10/09/2008  . DIABETES MELLITUS, TYPE II 02/28/2007  . HYPERTENSION 02/28/2007  . CORONARY ARTERY DISEASE 02/28/2007  . CONGESTIVE HEART FAILURE 02/28/2007  . RENAL FAILURE 02/28/2007  . CEREBROVASCULAR ACCIDENT, HX OF 02/28/2007   Past Medical History  Diagnosis Date  . Hyperlipidemia   . Foot pain     while lying flat  . Peripheral vascular disease   . Anemia   . Obesity   . Hypertension     Dr. Carole Binning- seen early (last) - 07/2012  . H/O cardiovascular stress test 2014     thru [facility] in [place] /w Dr. Carole Binning  . Diabetes mellitus     h/o , resolved since gastric bypass   . GERD (gastroesophageal reflux disease)     uses pepcid PRN  . CAD (coronary artery disease)     pt. rec. care fr. Dr. Cheri Kearns & Dr. Carole Binning, plan for defib placement on 09/19/2012    . Trace cataracts     Hx: of left  . CHF (congestive heart failure)   . Automatic implantable cardioverter-defibrillator in situ   . Thyroid condition     Hx: of 2005  . Myocardial infarction     "don't know when; just noted on earlier EKG" (08/20/2013)  . Stroke 1997    "left side is a little weaker since" (08/20/2013)  . Arthritis     "all over" (08/20/2013)  . Chronic lower back pain   . Chronic kidney disease (CKD), stage IV (severe)     Dr. Darrick Penna- pt. remarks its stage 4        Medication List         aspirin EC 81 MG tablet  Take 81 mg by mouth daily before breakfast.     famotidine 20 MG tablet  Commonly known as:  PEPCID  Take 20 mg by mouth daily as needed for heartburn.     furosemide 80 MG tablet  Commonly known as:  LASIX  Take 80 mg by mouth daily.     isosorbide mononitrate 30 MG 24 hr tablet  Commonly known as:  IMDUR  Take 30 mg by mouth daily before breakfast.     lisinopril 20 MG tablet  Commonly known as:  PRINIVIL,ZESTRIL  Take 20 mg by mouth at bedtime.     metoprolol 50 MG tablet  Commonly known as:  LOPRESSOR  Take 50 mg by mouth 2 (two) times daily.     oxyCODONE 5 MG immediate release tablet  Commonly known as:  ROXICODONE  Take 1 tablet (5 mg total) by mouth every 6 (six) hours as needed for severe pain.     sevelamer carbonate 800 MG tablet  Commonly known as:  RENVELA  Take 1,600 mg by mouth 3 (three) times daily with meals.     sodium bicarbonate 650 MG tablet  Take 1,300 mg by mouth 2 (two) times daily.     traZODone 100 MG tablet  Commonly known as:  DESYREL  Take 100 mg by mouth at bedtime.     warfarin 5 MG tablet  Commonly  known as:  COUMADIN  Take 5 mg by mouth daily. On Monday,Wednesday and Friday take 1 1/2 tablet  5mg  tablet.  On Tuesday, Thursday ,Saturday and Sunday take 1   5mg  tablet        Roxicodone #30 No Refill  Disposition: home  Patient's condition: is Good  Follow up: 1. Dr. Edilia Boickson in 2 weeks   Doreatha MassedSamantha Rhyne, PA-C [facility] [phone] 08/21/2013  7:46 AM

## 2013-08-21 NOTE — Discharge Summary (Signed)
Agree with plans for discharge today.  Christopher Dickson, MD, FACS Beeper [phone] 08/21/2013  

## 2013-08-21 NOTE — Progress Notes (Signed)
Discharged to home with family office visits in place teaching done  

## 2013-08-21 NOTE — Telephone Encounter (Addendum)
Message copied by Fredrich BirksMILLIKAN, DANA P on Wed Aug 21, 2013 10:05 AM ------      Message from: Sharee PimpleMCCHESNEY, MARILYN K      Created: Tue Aug 20, 2013 12:34 PM      Regarding: schedule                   ----- Message -----         From: Dara LordsSamantha J Rhyne, PA-C         Sent: 08/20/2013  12:29 PM           To: [facility] Charge Pool            S/p right thigh graft 08/20/13.  F/u with [facility] in 2 weeks.            Thanks,      Samantha ------  08/21/13: lm for pt re appt, dpm

## 2013-08-26 ENCOUNTER — Telehealth: Payer: Self-pay

## 2013-08-26 NOTE — Telephone Encounter (Signed)
Phone call from pt. to report symptoms of (R) thigh with swelling and warmth.  Stated he has not been elevating the right leg, as wasn't instructed to do so.  Reported that the incision is intact; denies redness/inflammation.  Denied fever/ chills.  Stated he was a little more active with walking on Saturday; reported he went to church on Sunday, and questioned if the increased activity caused the swelling.  Advised to elevate the right leg, with the right thigh positioned above level of heart 3-4 x/ day for 30-40 min. Intervals.  Advised to call if symptoms don't improve, or worsen:ie fever/ chills, inflammation at incisional area, incision opening-up, or drainage.  Verb. understanding.

## 2013-09-03 ENCOUNTER — Other Ambulatory Visit: Payer: Self-pay

## 2013-09-03 DIAGNOSIS — M7989 Other specified soft tissue disorders: Secondary | ICD-10-CM

## 2013-09-04 ENCOUNTER — Encounter: Payer: Self-pay | Admitting: Family

## 2013-09-05 ENCOUNTER — Ambulatory Visit (HOSPITAL_COMMUNITY)
Admission: RE | Admit: 2013-09-05 | Discharge: 2013-09-05 | Disposition: A | Discharge status: Home / Self Care | Payer: BC Managed Care – PPO | Source: Ambulatory Visit | Attending: Family | Admitting: Family

## 2013-09-05 ENCOUNTER — Ambulatory Visit (INDEPENDENT_AMBULATORY_CARE_PROVIDER_SITE_OTHER): Payer: BC Managed Care – PPO | Admitting: Family

## 2013-09-05 ENCOUNTER — Encounter: Payer: Self-pay | Admitting: Family

## 2013-09-05 VITALS — BP 97/62 | HR 65 | Temp 97.2°F | Resp 14 | Ht 73.0 in | Wt 178.0 lb

## 2013-09-05 DIAGNOSIS — M7989 Other specified soft tissue disorders: Secondary | ICD-10-CM | POA: Insufficient documentation

## 2013-09-05 DIAGNOSIS — M79609 Pain in unspecified limb: Secondary | ICD-10-CM

## 2013-09-05 NOTE — Progress Notes (Signed)
Established Dialysis Access  History of Present Illness  Willie [name] is a 63 y.o. (June 17, 1950) male patient of Dr. Edilia Boickson who is s/p right thigh graft AV graft (4-7 mm PTFE graft) on 08/20/2013; prior to this he had a  left AV fistula ligated on 07/04/2013. 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 He returns today for re-evaluation for c/o right leg swelling this week.  The patient is right hand dominant.   He is not on dialysis yet, states his kidney function is stable. Patient states that pain and swelling in right leg have decreased over this last week. He denies fever or chills  The patient's PMH, PSH, SH, FamHx, Med, and Allergies are unchanged from 07/24/2013.  On ROS today: see HPI for pertinent positives and negatives.  Physical Examination  Filed Vitals:   09/05/13 1157  BP: 97/62  Pulse: 65  Temp: 97.2 F (36.2 C)  TempSrc: Oral  Resp: 14  Height: 6\' 1"  (1.854 m)  Weight: 178 lb (80.74 kg)  SpO2: 100%   Body mass index is 23.49 kg/(m^2).  General: A&O x 3, WD.  Pulmonary: Sym exp, non labored respirations.  Cardiac: RRR  Vascular: Vessel Right Left  Radial Palpable Palpable  Ulnar Palpable Not Palpable  Brachial Palpable Palpable   Gastrointestinal: soft, NTND, -G/R, - HSM, - masses, - CVAT B.  Musculoskeletal: Moves all extremities WNL, Extremities without  ischemic changes , not palpable thrill in access in right upper leg, + bruit in access. Trace pretibial pitting edema in right lower leg, minimal edema surrounding right upper anterior thigh AV graft site, ne redness, no drainage.  Neurologic: Pain and light touch intact in extremities, Motor exam as listed above  Non-Invasive Vascular Imaging: (09/05/2013)  LOWER EXTREMITY VENOUS DUPLEX EVALUATION    INDICATION: Right lower extremity edema     PREVIOUS INTERVENTION(S): Status  post right thigh graft placement 08/20/2013    DUPLEX EXAM: Right lower extremity venous duplex     Common Femoral Vein  Femoral Vein Popliteal Vein Posterior Tibial Vein  Peroneal Vein Great Saphenous Vein   Right Left Right Left Right Left Right Left Right Left Right Left  Spontaneous + + +  +  +  +  +   Phasic + + +  +  +  +  +   Compressible + + +  +  +  +  +   Augmentation - + +  +  +  +  +   Competent - + +  +  +  +  +      Legend:  + = Yes, -  = No; P = Partial, D = Decreased, NV = Not Visualized, NA = Not Examined    Thrombosis - - -  -  -  -  -                                                         Legend:  A = Acute, C = Chronic, O = Obstructive, P = Partially Obstructive     ADDITIONAL FINDINGS: Mild fluid surrounding the proximal graft in the groin area    IMPRESSION: 1. Patent right thigh  graft. 2. No evidence of right lower extremity deep vein thrombosis. 3. The right greater saphenous vein and small saphenous vein visualized in the calf with no evidence of thrombus.    Medical Decision Making  Willie [name] is a 63 y.o. male who is s/p right thigh graft AV graft (4-7 mm PTFE graft) on 08/20/2013; prior to this he had a  left AV fistula ligated on 07/04/2013. 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 He returns today for re-evaluation for c/o right leg swelling this week.  The patient is right hand dominant.   He is not on dialysis yet, states his kidney function is stable. Patient states that pain and swelling in right leg have decreased over this last week. He denies fever or chills. Patent right thigh graft. No evidence of right lower extremity deep vein thrombosis. The right greater saphenous vein and small saphenous vein visualized in the calf with no evidence of thrombus. Mild fluid surrounding the proximal graft in the groin area.  Based on  today's exam and right LE venous Duplex exam, and after discussing with Dr. Darrick PennaFields, patient to follow up with Dr. Edilia Boickson in a month if he still has swelling in the right anterior thigh,  otherwise return as needed.   Charisse MarchSuzanne Pink Maye, RN, MSN, FNP-C [facility] [place] Office: [phone]  Clinic MD: Darrick PennaFields  09/05/2013, 12:07 PM

## 2013-09-11 ENCOUNTER — Encounter: Payer: BC Managed Care – PPO | Admitting: Vascular Surgery

## 2013-10-01 ENCOUNTER — Encounter: Payer: Self-pay | Admitting: Vascular Surgery

## 2013-10-02 ENCOUNTER — Encounter: Payer: BC Managed Care – PPO | Admitting: Vascular Surgery

## 2013-10-22 ENCOUNTER — Encounter: Payer: Self-pay | Admitting: Vascular Surgery

## 2013-10-23 ENCOUNTER — Encounter: Payer: Self-pay | Admitting: Vascular Surgery

## 2013-10-23 ENCOUNTER — Encounter: Payer: BC Managed Care – PPO | Admitting: Vascular Surgery

## 2013-10-24 ENCOUNTER — Encounter: Payer: BC Managed Care – PPO | Admitting: Vascular Surgery

## 2013-11-05 ENCOUNTER — Encounter: Payer: Self-pay | Admitting: Vascular Surgery

## 2013-11-06 ENCOUNTER — Encounter: Payer: BC Managed Care – PPO | Admitting: Vascular Surgery

## 2013-11-12 ENCOUNTER — Encounter: Payer: Self-pay | Admitting: Vascular Surgery

## 2013-11-13 ENCOUNTER — Encounter: Payer: Self-pay | Admitting: Vascular Surgery

## 2013-11-13 ENCOUNTER — Ambulatory Visit (INDEPENDENT_AMBULATORY_CARE_PROVIDER_SITE_OTHER): Payer: Self-pay | Admitting: Vascular Surgery

## 2013-11-13 VITALS — BP 85/52 | HR 120 | Ht 73.0 in | Wt 165.8 lb

## 2013-11-13 DIAGNOSIS — N186 End stage renal disease: Secondary | ICD-10-CM

## 2013-11-13 NOTE — Progress Notes (Signed)
   Patient name: Willie [name] MRN: [id number] DOB: 1950/09/25 Sex: male  REASON FOR VISIT: Follow up after placement of a new right thigh AV graft  HPI: Willie [name] is a 63 y.o. male who had a new right thigh AV graft placed on 08/20/2013. He comes in for a two-week follow up visit. He has no specific complaints. He is not yet on dialysis. He had a previous left upper tree DVT and central venogram on the right showed a central venous occlusion on the right. This is why we placed a thigh graft.  REVIEW OF SYSTEMS: Arly.Keller[X ] denotes positive finding; [  ] denotes negative finding  CARDIOVASCULAR:  [ ]  chest pain   [ ]  dyspnea on exertion    CONSTITUTIONAL:  [ ]  fever   [ ]  chills  PHYSICAL EXAM: Filed Vitals:   11/13/13 1426  BP: 85/52  Pulse: 120  Height: 6\' 1"  (1.854 m)  Weight: 165 lb 12.8 oz (75.206 kg)  SpO2: 100%   Body mass index is 21.88 kg/(m^2). GENERAL: The patient is a well-nourished male, in no acute distress. The vital signs are documented above. CARDIOVASCULAR: There is a regular rate and rhythm. PULMONARY: There is good air exchange bilaterally without wheezing or rales. His incisions have healed nicely. His right thigh AV graft has an excellent bruit and thrill.  MEDICAL ISSUES: The patient is doing well status post is that of a right thigh AV graft. I will see him back as needed.  DICKSON,CHRISTOPHER S [facility] [place] Beeper: [phone]

## 2014-05-01 ENCOUNTER — Encounter (HOSPITAL_COMMUNITY): Payer: Self-pay | Admitting: Vascular Surgery

## 2015-02-09 IMAGING — CR DG CHEST 2V
2 series · 2 of 2 positions shown · non-contrast
Comparison: None.

CLINICAL DATA: Hypertension; preoperative for arteriovenous fistula

CHEST - 2 VIEW

[view not recorded (1 of 2)]
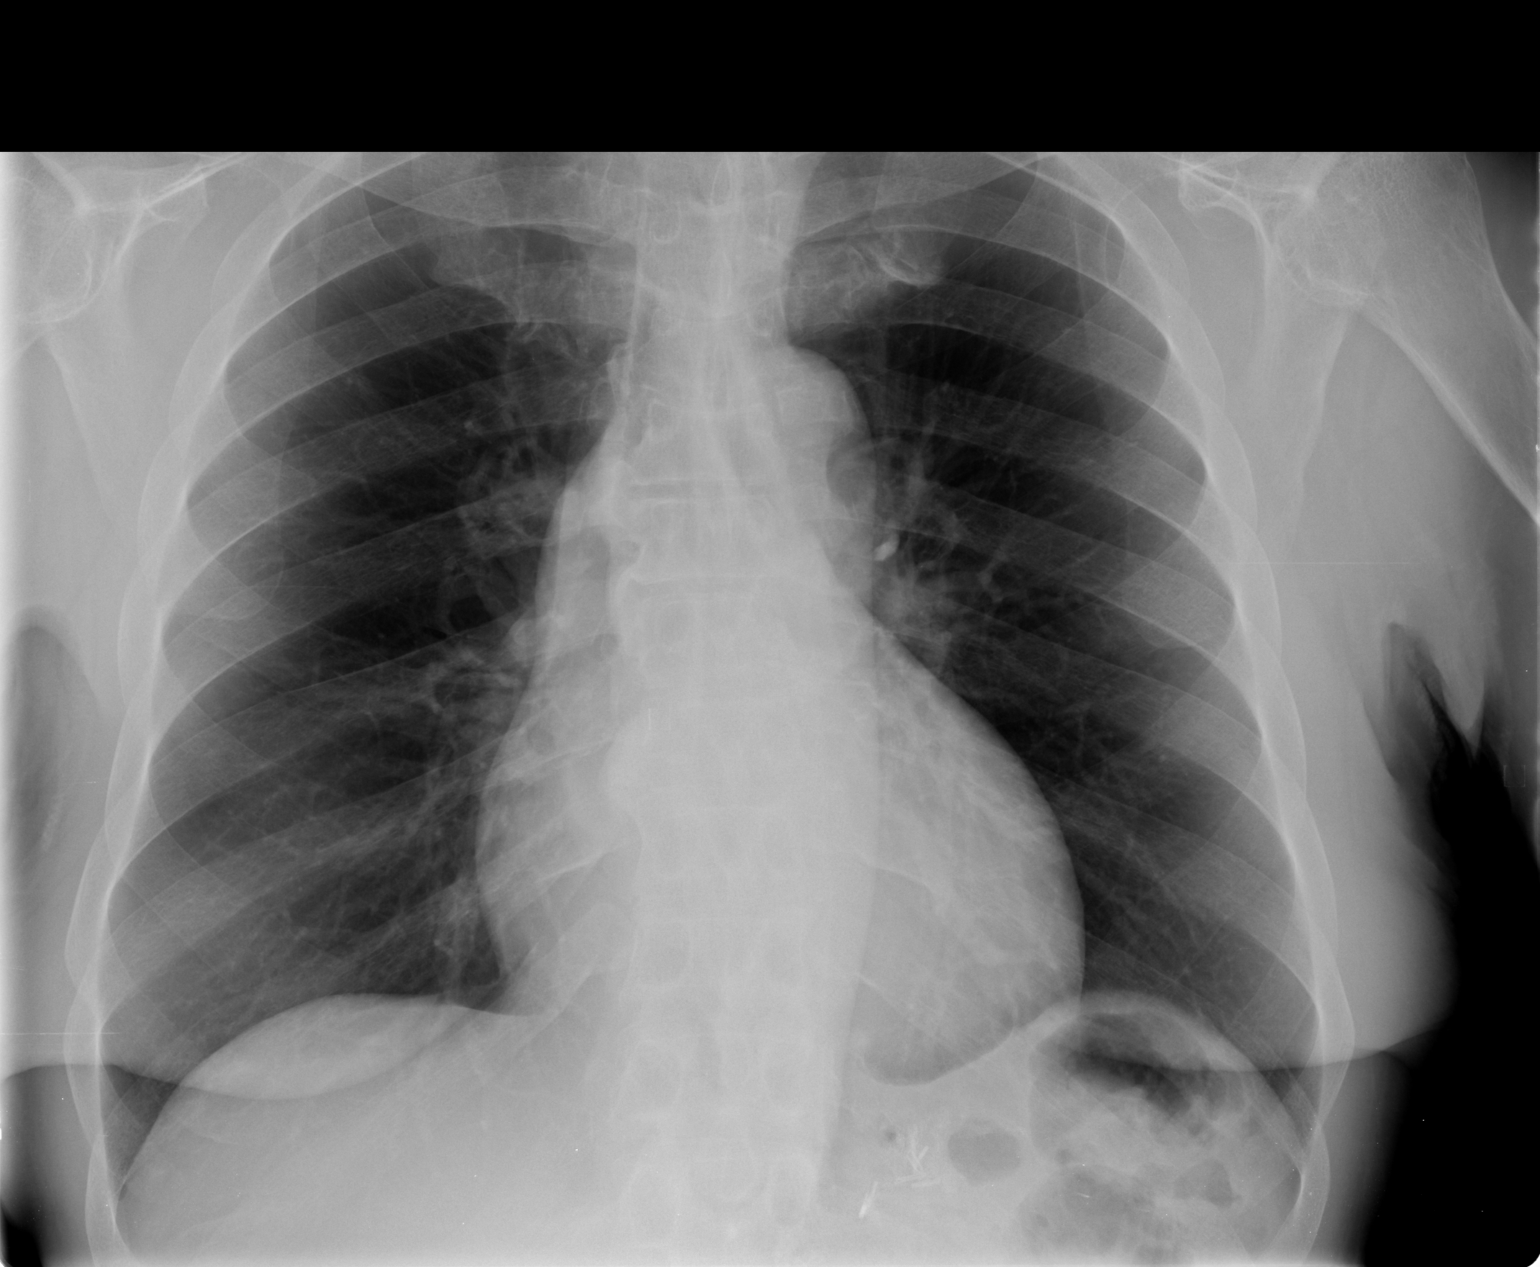

[view not recorded (2 of 2)]
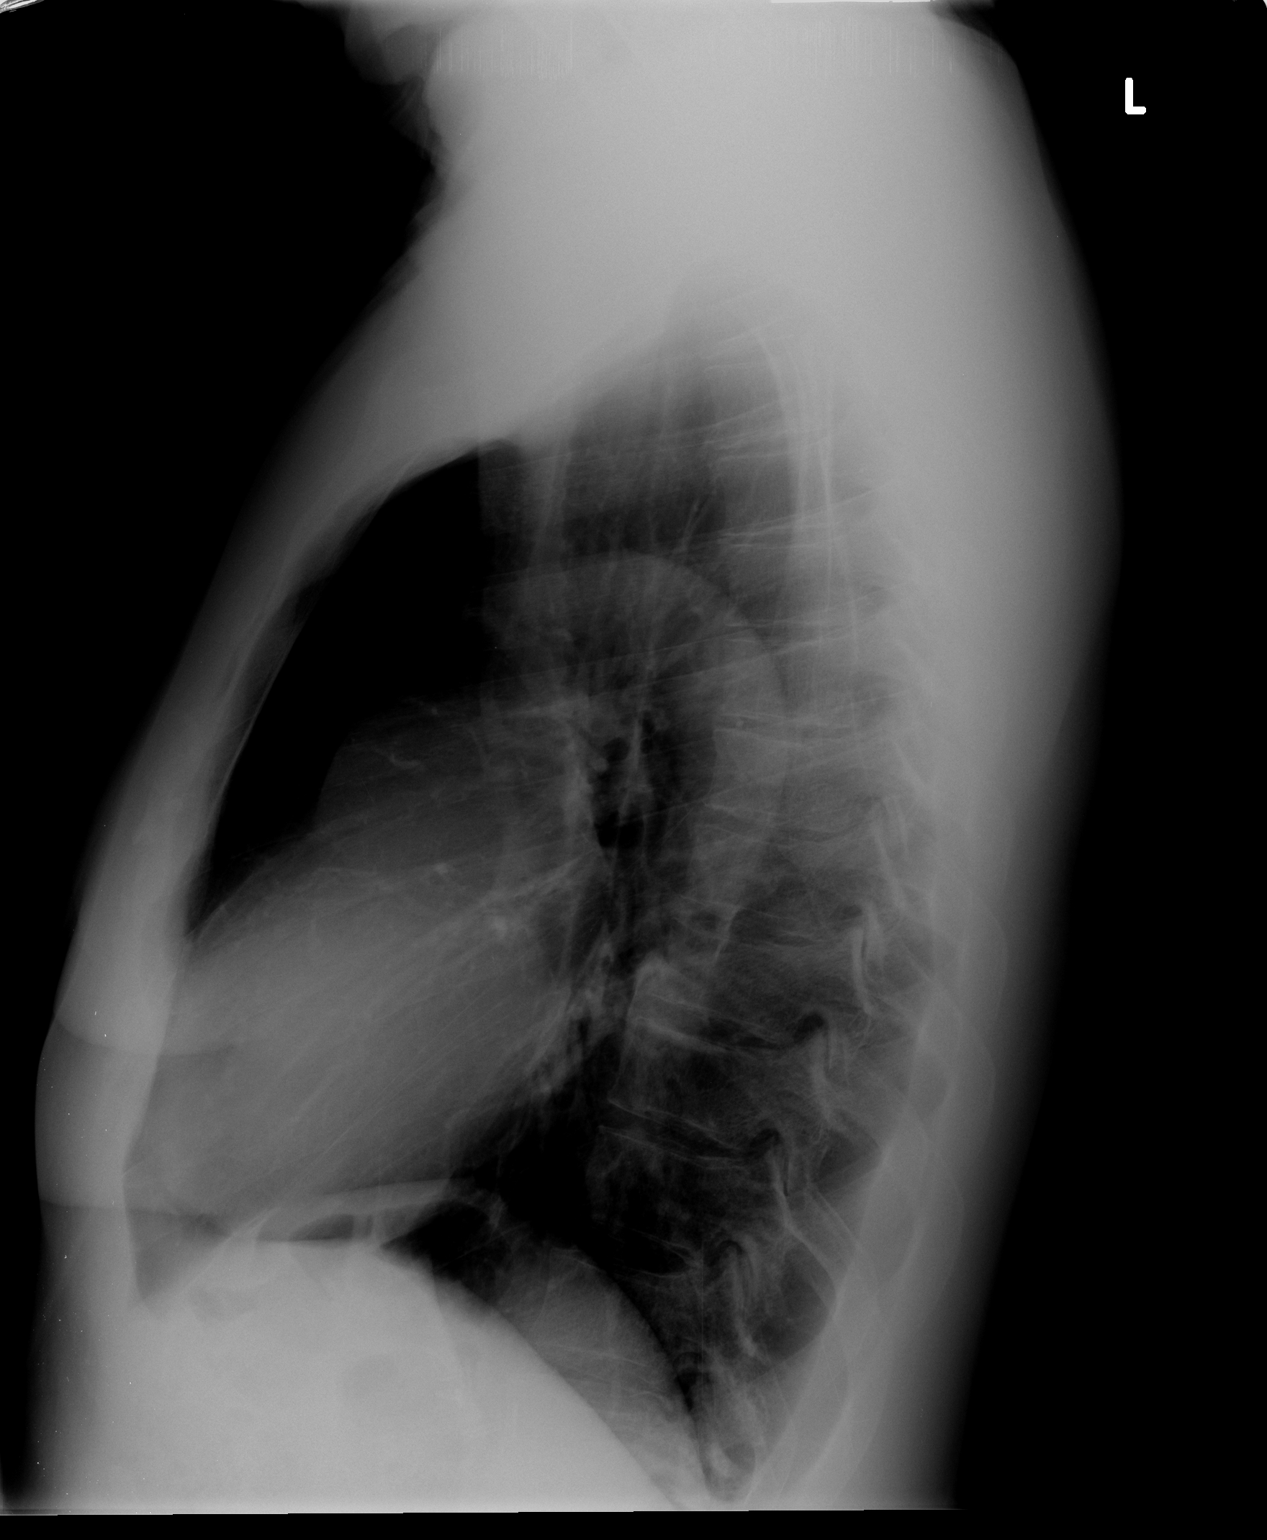

[2 of 2 positions shown; findings below may reference images not displayed]

FINDINGS: There is no edema or consolidation.  The heart size and
pulmonary vascularity are normal.  No adenopathy.  There is
degenerative change in the thoracic spine.
IMPRESSION: No edema or consolidation.

## 2015-02-20 ENCOUNTER — Encounter: Payer: Self-pay | Admitting: Gastroenterology

## 2016-01-22 DEATH — deceased

## 2018-12-01 ENCOUNTER — Encounter: Payer: Self-pay | Admitting: Gastroenterology
